# Patient Record
Sex: Female | Born: 1935 | Race: White | Hispanic: No | Marital: Married | Smoking: Never smoker
Health system: Southern US, Community
[De-identification: ages and names within clinical notes are randomized; demographics above are authoritative.]

## PROBLEM LIST (undated history)

## (undated) DIAGNOSIS — G610 Guillain-Barre syndrome: Secondary | ICD-10-CM

## (undated) DIAGNOSIS — I4891 Unspecified atrial fibrillation: Secondary | ICD-10-CM

## (undated) DIAGNOSIS — G629 Polyneuropathy, unspecified: Secondary | ICD-10-CM

## (undated) DIAGNOSIS — M6281 Muscle weakness (generalized): Secondary | ICD-10-CM

## (undated) DIAGNOSIS — E039 Hypothyroidism, unspecified: Secondary | ICD-10-CM

## (undated) DIAGNOSIS — B029 Zoster without complications: Secondary | ICD-10-CM

## (undated) DIAGNOSIS — I2699 Other pulmonary embolism without acute cor pulmonale: Secondary | ICD-10-CM

## (undated) HISTORY — DX: Hypothyroidism, unspecified: E03.9

## (undated) HISTORY — DX: Muscle weakness (generalized): M62.81

---

## 1963-11-28 HISTORY — PX: BREAST LUMPECTOMY: SHX2

## 1988-11-27 DIAGNOSIS — G610 Guillain-Barre syndrome: Secondary | ICD-10-CM

## 1988-11-27 DIAGNOSIS — I2699 Other pulmonary embolism without acute cor pulmonale: Secondary | ICD-10-CM

## 1988-11-27 HISTORY — DX: Guillain-Barre syndrome: G61.0

## 1988-11-27 HISTORY — DX: Other pulmonary embolism without acute cor pulmonale: I26.99

## 1998-03-29 ENCOUNTER — Other Ambulatory Visit: Admission: RE | Admit: 1998-03-29 | Discharge: 1998-03-29 | Payer: Self-pay | Admitting: Internal Medicine

## 1998-07-15 ENCOUNTER — Encounter: Admission: RE | Admit: 1998-07-15 | Discharge: 1998-10-13 | Payer: Self-pay | Admitting: Internal Medicine

## 2001-08-02 ENCOUNTER — Ambulatory Visit (HOSPITAL_COMMUNITY): Admission: RE | Admit: 2001-08-02 | Discharge: 2001-08-02 | Payer: Self-pay | Admitting: Gastroenterology

## 2004-02-15 ENCOUNTER — Encounter: Admission: RE | Admit: 2004-02-15 | Discharge: 2004-03-18 | Payer: Self-pay | Admitting: Internal Medicine

## 2006-03-07 ENCOUNTER — Inpatient Hospital Stay (HOSPITAL_COMMUNITY): Admission: EM | Admit: 2006-03-07 | Discharge: 2006-03-15 | Payer: Self-pay | Admitting: Emergency Medicine

## 2012-11-04 ENCOUNTER — Ambulatory Visit (INDEPENDENT_AMBULATORY_CARE_PROVIDER_SITE_OTHER): Payer: Medicare Other | Admitting: Emergency Medicine

## 2012-11-04 VITALS — BP 130/90 | HR 69 | Temp 97.4°F | Resp 16 | Wt 200.0 lb

## 2012-11-04 DIAGNOSIS — S91319A Laceration without foreign body, unspecified foot, initial encounter: Secondary | ICD-10-CM

## 2012-11-04 DIAGNOSIS — S91309A Unspecified open wound, unspecified foot, initial encounter: Secondary | ICD-10-CM

## 2012-11-04 NOTE — Progress Notes (Signed)
[facility] [address], [place] [address] [phone]  Date:  11/04/2012   Name:  Shirley Mccormick   DOB:  1925/04/11   MRN:  [id number]  PCP:  No primary provider on file.    Chief Complaint: Callouses   History of Present Illness:  Shirley Mccormick is a 78 y.o. very pleasant female patient who presents with the following:  Was picking at a callous on her foot this morning.  When she peeled off a piece of the skin, it began to briskly bleed.  She was unable to stop the bleeding and came here for evaluation.  She is allergic to tetanus and refused immunization.  She is taking coumadin.  Her most recent INR is not known but was therapeutic.  There is no problem list on file for this patient.   No past medical history on file.  No past surgical history on file.  History  Substance Use Topics  . Smoking status: Not on file  . Smokeless tobacco: Not on file  . Alcohol Use: Not on file    No family history on file.  Allergies  Allergen Reactions  . Erythromycin Rash  . Penicillins Rash  . Tetanus Toxoids Rash    Medication list has been reviewed and updated.  Current Outpatient Prescriptions on File Prior to Visit  Medication Sig Dispense Refill  . atenolol (TENORMIN) 50 MG tablet Take 50 mg by mouth daily.      . digoxin (LANOXIN) 0.125 MG tablet Take 0.125 mg by mouth daily.      . indapamide (LOZOL) 2.5 MG tablet Take 2.5 mg by mouth every morning.      Marland Kitchen levothyroxine (SYNTHROID, LEVOTHROID) 125 MCG tablet Take 125 mcg by mouth daily.      . potassium chloride (K-DUR) 10 MEQ tablet Take 10 mEq by mouth 2 (two) times daily.      Marland Kitchen warfarin (COUMADIN) 7.5 MG tablet Take 7.5 mg by mouth daily.        Review of Systems:  As per HPI, otherwise negative.    Physical Examination: Filed Vitals:   11/04/12 1329  BP: 130/90  Pulse: 69  Temp: 97.4 F (36.3 C)  Resp: 16   Filed Vitals:   11/04/12 1329  Weight: 200 lb (90.719 kg)    There is no height on file to calculate BMI. Ideal Body Weight:     GEN: WDWN, NAD, Non-toxic, Alert & Oriented x 3 HEENT: Atraumatic, Normocephalic.  Ears and Nose: No external deformity. EXTR: No clubbing/cyanosis/edema NEURO: Normal gait.  PSYCH: Normally interactive. Conversant. Not depressed or anxious appearing.  Calm demeanor.  Avulsion of skin from sole of foot.  Bleeding but slowed.    Assessment and Plan: Treated with drisol and pressure and wound dressed. Follow up in one week.  Carmelina Dane, MD

## 2013-10-01 ENCOUNTER — Emergency Department (HOSPITAL_COMMUNITY)
Admission: EM | Admit: 2013-10-01 | Discharge: 2013-10-01 | Disposition: A | Discharge status: Home / Self Care | Payer: Medicare Other | Attending: Emergency Medicine | Admitting: Emergency Medicine

## 2013-10-01 ENCOUNTER — Encounter (HOSPITAL_COMMUNITY): Payer: Self-pay | Admitting: Emergency Medicine

## 2013-10-01 DIAGNOSIS — R5381 Other malaise: Secondary | ICD-10-CM | POA: Insufficient documentation

## 2013-10-01 DIAGNOSIS — R29898 Other symptoms and signs involving the musculoskeletal system: Secondary | ICD-10-CM

## 2013-10-01 DIAGNOSIS — Z8619 Personal history of other infectious and parasitic diseases: Secondary | ICD-10-CM | POA: Insufficient documentation

## 2013-10-01 DIAGNOSIS — Z7901 Long term (current) use of anticoagulants: Secondary | ICD-10-CM | POA: Insufficient documentation

## 2013-10-01 DIAGNOSIS — Z79899 Other long term (current) drug therapy: Secondary | ICD-10-CM | POA: Insufficient documentation

## 2013-10-01 DIAGNOSIS — M6250 Muscle wasting and atrophy, not elsewhere classified, unspecified site: Secondary | ICD-10-CM | POA: Insufficient documentation

## 2013-10-01 DIAGNOSIS — B029 Zoster without complications: Secondary | ICD-10-CM

## 2013-10-01 DIAGNOSIS — E079 Disorder of thyroid, unspecified: Secondary | ICD-10-CM | POA: Insufficient documentation

## 2013-10-01 DIAGNOSIS — I4891 Unspecified atrial fibrillation: Secondary | ICD-10-CM | POA: Insufficient documentation

## 2013-10-01 DIAGNOSIS — Z880 Allergy status to penicillin: Secondary | ICD-10-CM | POA: Insufficient documentation

## 2013-10-01 DIAGNOSIS — Z86711 Personal history of pulmonary embolism: Secondary | ICD-10-CM | POA: Insufficient documentation

## 2013-10-01 DIAGNOSIS — Z8669 Personal history of other diseases of the nervous system and sense organs: Secondary | ICD-10-CM | POA: Insufficient documentation

## 2013-10-01 HISTORY — DX: Other pulmonary embolism without acute cor pulmonale: I26.99

## 2013-10-01 HISTORY — DX: Zoster without complications: B02.9

## 2013-10-01 HISTORY — DX: Guillain-Barre syndrome: G61.0

## 2013-10-01 HISTORY — DX: Polyneuropathy, unspecified: G62.9

## 2013-10-01 HISTORY — DX: Unspecified atrial fibrillation: I48.91

## 2013-10-01 LAB — URINALYSIS W MICROSCOPIC + REFLEX CULTURE
Bilirubin Urine: NEGATIVE
Hgb urine dipstick: NEGATIVE
Specific Gravity, Urine: 1.015 (ref 1.005–1.030)
Urobilinogen, UA: 0.2 mg/dL (ref 0.0–1.0)
pH: 5.5 (ref 5.0–8.0)

## 2013-10-01 LAB — CBC WITH DIFFERENTIAL/PLATELET
Basophils Absolute: 0 10*3/uL (ref 0.0–0.1)
Basophils Relative: 0 % (ref 0–1)
Eosinophils Absolute: 0.1 10*3/uL (ref 0.0–0.7)
Eosinophils Relative: 2 % (ref 0–5)
HCT: 39.3 % (ref 36.0–46.0)
Hemoglobin: 13.5 g/dL (ref 12.0–15.0)
MCHC: 34.4 g/dL (ref 30.0–36.0)
MCV: 87.5 fL (ref 78.0–100.0)
Monocytes Absolute: 0.6 10*3/uL (ref 0.1–1.0)
Monocytes Relative: 11 % (ref 3–12)

## 2013-10-01 LAB — PROTIME-INR: Prothrombin Time: 27.2 seconds — ABNORMAL HIGH (ref 11.6–15.2)

## 2013-10-01 LAB — COMPREHENSIVE METABOLIC PANEL
Alkaline Phosphatase: 47 U/L (ref 39–117)
BUN: 12 mg/dL (ref 6–23)
CO2: 26 mEq/L (ref 19–32)
Chloride: 92 mEq/L — ABNORMAL LOW (ref 96–112)
GFR calc Af Amer: 71 mL/min — ABNORMAL LOW (ref >90)
Total Bilirubin: 0.4 mg/dL (ref 0.3–1.2)
Total Protein: 7.6 g/dL (ref 6.0–8.3)

## 2013-10-01 NOTE — Progress Notes (Signed)
Pt accepted to [facility], Skilled Nursing Facility. RN can call report to [phone]. Pt to be transported by non emergency EMS. .No further Clinical Social Work needs, signing off.   Catha Gosselin, LCSW [phone]  ED CSW .10/01/2013 1435pm

## 2013-10-01 NOTE — ED Notes (Addendum)
[facility] pt c/o weakness x 1 week. Denies n/v, pain. No complaints of any other symptoms. Started medication for shingles and states she started to feel more weak.

## 2013-10-01 NOTE — ED Provider Notes (Signed)
CSN: [id number]     Arrival date & time 10/01/13  1009 History   First MD Initiated Contact with Patient 10/01/13 1121     Chief Complaint  Patient presents with  . Weakness   (Consider location/radiation/quality/duration/timing/severity/associated sxs/prior Treatment) Patient is a 79 y.o. female presenting with weakness. The history is provided by the patient and the spouse.  Weakness This is a new problem. The current episode started more than 2 days ago. The problem occurs constantly. The problem has been gradually worsening. Pertinent negatives include no chest pain, no abdominal pain, no headaches and no shortness of breath. Nothing aggravates the symptoms. Nothing relieves the symptoms. She has tried nothing for the symptoms. The treatment provided no relief.    Past Medical History  Diagnosis Date  . Afib   . Shingles   . Pulmonary embolism 1990  . Guillain-Barre 1990    resolved  . Thyroid disease hypo  . Neuropathy    History reviewed. No pertinent past surgical history. No family history on file. History  Substance Use Topics  . Smoking status: Not on file  . Smokeless tobacco: Not on file  . Alcohol Use: Not on file   OB History   Grav Para Term Preterm Abortions TAB SAB Ect Mult Living                 Review of Systems  Constitutional: Negative for fever and fatigue.  HENT: Negative for congestion and drooling.   Eyes: Negative for pain.  Respiratory: Negative for cough and shortness of breath.   Cardiovascular: Negative for chest pain.  Gastrointestinal: Negative for nausea, vomiting, abdominal pain and diarrhea.  Genitourinary: Negative for dysuria and hematuria.  Musculoskeletal: Negative for back pain, gait problem and neck pain.  Skin: Negative for color change.  Neurological: Positive for weakness. Negative for dizziness and headaches.  Hematological: Negative for adenopathy.  Psychiatric/Behavioral: Negative for behavioral problems.  All other  systems reviewed and are negative.    Allergies  Erythromycin; Penicillins; and Tetanus toxoids  Home Medications   Current Outpatient Rx  Name  Route  Sig  Dispense  Refill  . acetaminophen (TYLENOL) 650 MG CR tablet   Oral   Take 650 mg by mouth every 8 (eight) hours as needed for pain.          Marland Kitchen atenolol (TENORMIN) 50 MG tablet   Oral   Take 50 mg by mouth daily.         . cholecalciferol (VITAMIN D) 1000 UNITS tablet   Oral   Take 1,000 Units by mouth daily.         Marland Kitchen darifenacin (ENABLEX) 7.5 MG 24 hr tablet   Oral   Take 7.5 mg by mouth daily.         . digoxin (LANOXIN) 0.125 MG tablet   Oral   Take 0.125 mg by mouth daily.         . fish oil-omega-3 fatty acids 1000 MG capsule   Oral   Take 2 g by mouth daily.         Marland Kitchen glucosamine-chondroitin 500-400 MG tablet   Oral   Take 1 tablet by mouth 3 (three) times daily.         . indapamide (LOZOL) 2.5 MG tablet   Oral   Take 2.5 mg by mouth every morning.         Marland Kitchen levothyroxine (SYNTHROID, LEVOTHROID) 125 MCG tablet   Oral   Take 125 mcg by  mouth daily.         . Multiple Vitamins-Minerals (MULTIVITAMIN WITH IRON-MINERALS) liquid   Oral   Take by mouth daily.         . potassium chloride (K-DUR) 10 MEQ tablet   Oral   Take 10 mEq by mouth 2 (two) times daily.         Marland Kitchen warfarin (COUMADIN) 7.5 MG tablet   Oral   Take 7.5 mg by mouth daily.          BP 142/48  Pulse 82  Temp(Src) 97.8 F (36.6 C) (Oral)  Resp 20  SpO2 97% Physical Exam  Nursing note and vitals reviewed. Constitutional: She is oriented to person, place, and time. She appears well-developed and well-nourished.  HENT:  Head: Normocephalic.  Mouth/Throat: No oropharyngeal exudate.  Eyes: Conjunctivae and EOM are normal. Pupils are equal, round, and reactive to light.  Neck: Normal range of motion. Neck supple.  Cardiovascular: Normal rate, regular rhythm, normal heart sounds and intact distal pulses.   Exam reveals no gallop and no friction rub.   No murmur heard. Pulmonary/Chest: Effort normal and breath sounds normal. No respiratory distress. She has no wheezes.  Abdominal: Soft. Bowel sounds are normal. There is no tenderness. There is no rebound and no guarding.  Musculoskeletal: Normal range of motion. She exhibits no edema and no tenderness.  Neurological: She is alert and oriented to person, place, and time. No cranial nerve deficit or sensory deficit.  Reflex Scores:      Patellar reflexes are 1+ on the right side and 1+ on the left side.      Achilles reflexes are 1+ on the right side and 1+ on the left side. 4/5 strength in RLE. 2/5 strength in LLE.   Normal strength in UE's.   Skin: Skin is warm and dry.  Psychiatric: She has a normal mood and affect. Her behavior is normal.    ED Course  Procedures (including critical care time) Labs Review Labs Reviewed  COMPREHENSIVE METABOLIC PANEL - Abnormal; Notable for the following:    Sodium 131 (*)    Chloride 92 (*)    Glucose, Bld 109 (*)    GFR calc non Af Amer 61 (*)    GFR calc Af Amer 71 (*)    All other components within normal limits  PROTIME-INR - Abnormal; Notable for the following:    Prothrombin Time 27.2 (*)    INR 2.63 (*)    All other components within normal limits  URINALYSIS W MICROSCOPIC + REFLEX CULTURE  CBC WITH DIFFERENTIAL   Imaging Review No results found.  EKG Interpretation     Ventricular Rate:  80 PR Interval:    QRS Duration: 72 QT Interval:  394 QTC Calculation: 454 R Axis:   45 Text Interpretation:  Atrial fibrillation Anteroseptal infarct, old            MDM   1. Muscular deconditioning   2. Lower extremity weakness    12:34 PM 79 y.o. female who presents with weakness in her lower extremities which began gradually 4-5 days ago. The patient was diagnosed with shingles on her left lower back and left abdomen last week. The husband notes that the patient had difficulty  ambulating at baseline. Since she has gotten shingles she has become weaker and is now not able to get up to go to the bathroom. Her husband was able to help her initially but now he is unable to help get  her to the bathroom. She denies any vomiting, diarrhea, or fevers. The patient is afebrile and vital signs are unremarkable here. I suspect deconditioning from her episode of shingles.   2:50 PM: Interpreted labs which are non-contrib. While pt does have a remote hx of guillain-barre syndrome, I don't think that is what is causing her sx today. I had SW see here and SW has arranged for her to go to a SNF [facility]). I have discussed the diagnosis/risks/treatment options with the patient and family and believe the pt to be eligible for discharge home to follow-up with pcp as needed. We also discussed returning to the ED immediately if new or worsening sx occur. We discussed the sx which are most concerning (e.g., worsening weakness, fever, worsening rash) that necessitate immediate return. Any new prescriptions provided to the patient are listed below.  New Prescriptions   No medications on file     Junius Argyle, MD 10/01/13 1542

## 2013-10-01 NOTE — Progress Notes (Signed)
   CARE MANAGEMENT ED NOTE 10/01/2013  Patient:  Shirley Mccormick, Shirley Mccormick   Account Number:  [id number]  Date Initiated:  10/01/2013  Documentation initiated by:  Edd Arbour  Subjective/Objective Assessment:   79 yr old [facility] lives [facility] with spouse who is at the bedside PMH Guillain barre and peripheral neuropathy dx in [facility] ED with shingles c/o weakness     Subjective/Objective Assessment Detail:   spouse states sent to ED by their facility Spouse refuses home health services as indicated in EDP's Cm consult He want pt to be moved from independent level of care to snf level of care until she is able to use her walker States he was informed that the ED SW needs to speak with the facility SW     Action/Plan:   CM spoke with pt and spouse and then called EDP and ED SW   Action/Plan Detail:   Anticipated DC Date:  10/01/2013     Status Recommendation to Physician:   Result of Recommendation:    Other ED Services  Consult Working Plan   In-house referral  Clinical Social Worker   DC Planning Services  Outpatient Services - Pt will follow up  Patient refused services  CM consult    Choice offered to / List presented to:  C-1 Patient          Status of service:  Completed, signed off  ED Comments:   ED Comments Detail:

## 2013-10-01 NOTE — Progress Notes (Signed)
CSW received consult from rn cm, who states patient and pt husband are interested in patient moving from independently living to health care center. Pt recently diagnosed with shingles. CSW spoke with [facility], admissions coordinator at [phone] [facility] regarding patient admission. CSW completing FL2 to submit to [facility].    .Catha Gosselin, [place] [phone]  ED CSW .10/01/2013 1314pm

## 2013-10-01 NOTE — ED Notes (Signed)
Bed: WU98 Expected date:  Expected time:  Means of arrival:  Comments: weakness

## 2013-10-02 ENCOUNTER — Encounter: Payer: Self-pay | Admitting: Internal Medicine

## 2013-10-02 ENCOUNTER — Non-Acute Institutional Stay (SKILLED_NURSING_FACILITY): Payer: Medicare Other | Admitting: Internal Medicine

## 2013-10-02 DIAGNOSIS — B029 Zoster without complications: Secondary | ICD-10-CM

## 2013-10-02 DIAGNOSIS — M6250 Muscle wasting and atrophy, not elsewhere classified, unspecified site: Secondary | ICD-10-CM

## 2013-10-02 DIAGNOSIS — R29898 Other symptoms and signs involving the musculoskeletal system: Secondary | ICD-10-CM

## 2013-10-02 LAB — TSH: TSH: 3.57 u[IU]/mL (ref 0.41–5.90)

## 2013-10-02 LAB — CBC AND DIFFERENTIAL
Platelets: 210 10*3/uL (ref 150–399)
WBC: 5.2 10^3/mL

## 2013-10-02 LAB — BASIC METABOLIC PANEL
BUN: 11 mg/dL (ref 4–21)
Creatinine: 0.7 mg/dL (ref 0.5–1.1)
Sodium: 133 mmol/L — AB (ref 137–147)

## 2013-10-02 NOTE — Progress Notes (Signed)
Patient ID: Shirley Mccormick, female   DOB: May 27, 1925, 79 y.o.   MRN: [id number] Nursing Home Location:  [facility] , Room N32  Place of Service: SNF (31)  PCP: Gwen Pounds, MD  Code Status: living will   Allergies  Allergen Reactions  . Erythromycin Rash  . Penicillins Rash  . Tetanus Toxoids Rash    Chief Complaint  Patient presents with  . Hospitalization Follow-up    New SNF  admission    HPI:  Patient was admitted to skilled nursing facility following a trip to the emergency room on 10/01/13. She had the onset of a rash on the left flank and abdomen starting 09/29/13. This is herpes zoster. At the same time she has become exceedingly weak and is unable to stand safely. Her husband could not assist her to the bathroom. She has been living independently with her husband at [facility]. She as not safe to return to her apartment at this time due to her weakness and risk of falling. Her husband cannot manage her.  Patient has a known prior history of Guillain-Barr syndrome in 1990. She had an early complete recovery initially, then seemed to relapse with increasing weakness in the muscles in the last decade. Despite the weakness she has been able to toilet and Bathe.  She is now here for short-term rehabilitation. She is on valacyclovir for the herpes zoster. Some skin is peeling on the left flank as result of her herpes zoster, but she is not experiencing any pain. She will be referred to physical therapy and occupational therapy to maximize her return to self-care.  Anticoagulated with warfarin due to chronic AF and a prior history of PE.  Past Medical History  Diagnosis Date  . Afib   . Shingles 10/01/13  . Pulmonary embolism 1990  . Guillain-Barre 1990    resolved  . Unspecified hypothyroidism   . Neuropathy    Past Surgical History  Procedure Laterality Date  . Breast lumpectomy Right 1965    Dr. Judithe Modest   Immunization History  Administered Date(s)  Administered  . Pneumococcal Conjugate 01/09/1997  Allergic to Tetanus   Social History:   History   Social History  . Marital Status: Married    Spouse Name: N/A    Number of Children: N/A  . Years of Education: N/A   Social History Main Topics  . Smoking status: Never Smoker   . Smokeless tobacco: Not on file  . Alcohol Use: No  . Drug Use: No  . Sexual Activity: Not Currently   Other Topics Concern  . Not on file   Social History Narrative   Living Will   Resident at [facility] since 2006.   Lived in Independent Living with her husband.     Family Status  Relation Status Death Age  . Father Deceased 57    coronary disease  . Mother Deceased 36    old age  . Sister Deceased 36    coronary disease  . Sister Deceased 46    coronary disease  . Brother Deceased 21    plane crash     Medications: Patient's Medications  New Prescriptions   No medications on file  Previous Medications   ACETAMINOPHEN (TYLENOL) 650 MG CR TABLET    Take 650 mg by mouth every 8 (eight) hours as needed for pain.    ATENOLOL (TENORMIN) 50 MG TABLET    Take 50 mg by mouth daily.   CHOLECALCIFEROL (VITAMIN  D) 1000 UNITS TABLET    Take 1,000 Units by mouth daily.   DARIFENACIN (ENABLEX) 7.5 MG 24 HR TABLET    Take 7.5 mg by mouth daily.   DIGOXIN (LANOXIN) 0.125 MG TABLET    Take 0.125 mg by mouth daily.   FISH OIL-OMEGA-3 FATTY ACIDS 1000 MG CAPSULE    Take 2 g by mouth daily.   GLUCOSAMINE-CHONDROITIN 500-400 MG TABLET    Take 1 tablet by mouth 3 (three) times daily.   INDAPAMIDE (LOZOL) 2.5 MG TABLET    Take 2.5 mg by mouth every morning.   LEVOTHYROXINE (SYNTHROID, LEVOTHROID) 125 MCG TABLET    Take 125 mcg by mouth daily.   MULTIPLE VITAMINS-MINERALS (MULTIVITAMIN WITH IRON-MINERALS) LIQUID    Take by mouth daily.   POTASSIUM CHLORIDE (K-DUR) 10 MEQ TABLET    Take 10 mEq by mouth 2 (two) times daily.   VALACYCLOVIR (VALTREX) 1000 MG TABLET       WARFARIN (COUMADIN) 7.5 MG TABLET     Take 7.5 mg by mouth daily.  Modified Medications   No medications on file  Discontinued Medications   No medications on file   Review of Systems  Constitutional: Positive for malaise/fatigue. Negative for fever, chills, weight loss and diaphoresis.  HENT: Positive for hearing loss. Negative for ear pain, sore throat and tinnitus.   Eyes: Negative for blurred vision, double vision, photophobia and discharge.  Respiratory: Positive for shortness of breath. Negative for hemoptysis, sputum production and wheezing.   Cardiovascular: Negative for chest pain, palpitations, orthopnea, claudication, leg swelling and PND.  Gastrointestinal: Negative for heartburn, nausea, vomiting, abdominal pain, diarrhea, constipation and blood in stool.  Genitourinary: Positive for urgency. Negative for dysuria, frequency, hematuria and flank pain.  Musculoskeletal: Positive for back pain, myalgias and neck pain. Negative for joint pain.       Unstable balance. High risk for falls.  Skin: Positive for rash.       New herpes zoster of the left flank and abdomen.  Neurological: Positive for sensory change, focal weakness and weakness. Negative for dizziness, tingling, tremors, speech change, seizures and loss of consciousness.  Endo/Heme/Allergies: Negative for environmental allergies and polydipsia. Does not bruise/bleed easily.  Psychiatric/Behavioral: Negative for depression, suicidal ideas, hallucinations and substance abuse. The patient is not nervous/anxious and does not have insomnia.    BP 142/92  Pulse 84  Temp(Src) 99 F (37.2 C)  Resp 20  Ht 5\' 5"  (1.651 m)  Wt 200 lb (90.719 kg)  BMI 33.28 kg/m2   Physical Exam  Constitutional: She is oriented to person, place, and time. She appears distressed.  Moderately overweight.  HENT:  Head: Normocephalic and atraumatic.  Right Ear: External ear normal.  Left Ear: External ear normal.  Nose: Nose normal.  Mouth/Throat: Oropharynx is clear and moist.   Significant hearing loss. Generally wears hearing aids.  Eyes: Conjunctivae and EOM are normal. Pupils are equal, round, and reactive to light.  Left upper eyelid droop.  Neck: No JVD present. No tracheal deviation present. No thyromegaly present.  Cardiovascular: Normal rate and normal heart sounds.  Exam reveals no gallop and no friction rub.   No murmur heard. Atrial fibrillation.  Pulmonary/Chest: Effort normal and breath sounds normal. No respiratory distress. She has no wheezes. She has no rales.  Abdominal: She exhibits no distension and no mass. There is no tenderness.  Musculoskeletal: Normal range of motion. She exhibits tenderness (Tender in both knees). She exhibits no edema.  Lymphadenopathy:  She has no cervical adenopathy.  Neurological: She is alert and oriented to person, place, and time. No cranial nerve deficit.  Weakness in both legs. Unable to stand unassisted.  Skin: Rash (Herpes zoster left flank and abdomen) noted. No erythema. No pallor.  Psychiatric: She has a normal mood and affect. Her behavior is normal. Thought content normal.     Labs reviewed: Admission on 10/01/2013, Discharged on 10/01/2013  Component Date Value Range Status  . Sodium 10/01/2013 131* 135 - 145 mEq/L Final  . Potassium 10/01/2013 3.7  3.5 - 5.1 mEq/L Final  . Chloride 10/01/2013 92* 96 - 112 mEq/L Final  . CO2 10/01/2013 26  19 - 32 mEq/L Final  . Glucose, Bld 10/01/2013 109* 70 - 99 mg/dL Final  . BUN 16/08/9603 12  6 - 23 mg/dL Final  . Creatinine, Ser 10/01/2013 0.84  0.50 - 1.10 mg/dL Final  . Calcium 54/07/8118 9.4  8.4 - 10.5 mg/dL Final  . Total Protein 10/01/2013 7.6  6.0 - 8.3 g/dL Final  . Albumin 14/78/2956 3.7  3.5 - 5.2 g/dL Final  . AST 21/30/8657 31  0 - 37 U/L Final  . ALT 10/01/2013 21  0 - 35 U/L Final  . Alkaline Phosphatase 10/01/2013 47  39 - 117 U/L Final  . Total Bilirubin 10/01/2013 0.4  0.3 - 1.2 mg/dL Final  . GFR calc non Af Amer 10/01/2013 61* >90  mL/min Final  . GFR calc Af Amer 10/01/2013 71* >90 mL/min Final   Comment: (NOTE)                          The eGFR has been calculated using the CKD EPI equation.                          This calculation has not been validated in all clinical situations.                          eGFR's persistently <90 mL/min signify possible Chronic Kidney                          Disease.  Marland Kitchen Prothrombin Time 10/01/2013 27.2* 11.6 - 15.2 seconds Final  . INR 10/01/2013 2.63* 0.00 - 1.49 Final  . Color, Urine 10/01/2013 YELLOW  YELLOW Final  . APPearance 10/01/2013 CLEAR  CLEAR Final  . Specific Gravity, Urine 10/01/2013 1.015  1.005 - 1.030 Final  . pH 10/01/2013 5.5  5.0 - 8.0 Final  . Glucose, UA 10/01/2013 NEGATIVE  NEGATIVE mg/dL Final  . Hgb urine dipstick 10/01/2013 NEGATIVE  NEGATIVE Final  . Bilirubin Urine 10/01/2013 NEGATIVE  NEGATIVE Final  . Ketones, ur 10/01/2013 NEGATIVE  NEGATIVE mg/dL Final  . Protein, ur 84/69/6295 NEGATIVE  NEGATIVE mg/dL Final  . Urobilinogen, UA 10/01/2013 0.2  0.0 - 1.0 mg/dL Final  . Nitrite 28/41/3244 NEGATIVE  NEGATIVE Final  . Leukocytes, UA 10/01/2013 NEGATIVE  NEGATIVE Final  . WBC, UA 10/01/2013 0-2  <3 WBC/hpf Final  . WBC 10/01/2013 5.2  4.0 - 10.5 K/uL Final  . RBC 10/01/2013 4.49  3.87 - 5.11 MIL/uL Final  . Hemoglobin 10/01/2013 13.5  12.0 - 15.0 g/dL Final  . HCT 11/29/7251 39.3  36.0 - 46.0 % Final  . MCV 10/01/2013 87.5  78.0 - 100.0 fL Final  . MCH 10/01/2013 30.1  26.0 -  34.0 pg Final  . MCHC 10/01/2013 34.4  30.0 - 36.0 g/dL Final  . RDW 78/29/5621 13.0  11.5 - 15.5 % Final  . Platelets 10/01/2013 187  150 - 400 K/uL Final  . Neutrophils Relative % 10/01/2013 58  43 - 77 % Final  . Neutro Abs 10/01/2013 3.0  1.7 - 7.7 K/uL Final  . Lymphocytes Relative 10/01/2013 29  12 - 46 % Final  . Lymphs Abs 10/01/2013 1.5  0.7 - 4.0 K/uL Final  . Monocytes Relative 10/01/2013 11  3 - 12 % Final  . Monocytes Absolute 10/01/2013 0.6  0.1 - 1.0 K/uL  Final  . Eosinophils Relative 10/01/2013 2  0 - 5 % Final  . Eosinophils Absolute 10/01/2013 0.1  0.0 - 0.7 K/uL Final  . Basophils Relative 10/01/2013 0  0 - 1 % Final  . Basophils Absolute 10/01/2013 0.0  0.0 - 0.1 K/uL Final     Assessment/Plan  Lower extremity weakness: Enroll in physical therapy for strengthening, training in safe ambulation, and to increase self care ability.  Muscular deconditioning: refer to PT  Herpes zoster: finish another week of valacyclovir  Anticoagulation: follow INR (2.63 yesterday)

## 2013-10-03 ENCOUNTER — Non-Acute Institutional Stay (SKILLED_NURSING_FACILITY): Payer: Medicare Other | Admitting: Nurse Practitioner

## 2013-10-03 ENCOUNTER — Encounter: Payer: Self-pay | Admitting: Nurse Practitioner

## 2013-10-03 DIAGNOSIS — I4891 Unspecified atrial fibrillation: Secondary | ICD-10-CM

## 2013-10-03 DIAGNOSIS — Z5181 Encounter for therapeutic drug level monitoring: Secondary | ICD-10-CM | POA: Insufficient documentation

## 2013-10-03 DIAGNOSIS — E039 Hypothyroidism, unspecified: Secondary | ICD-10-CM | POA: Insufficient documentation

## 2013-10-03 DIAGNOSIS — E871 Hypo-osmolality and hyponatremia: Secondary | ICD-10-CM

## 2013-10-03 DIAGNOSIS — R32 Unspecified urinary incontinence: Secondary | ICD-10-CM

## 2013-10-03 DIAGNOSIS — B029 Zoster without complications: Secondary | ICD-10-CM

## 2013-10-03 DIAGNOSIS — R269 Unspecified abnormalities of gait and mobility: Secondary | ICD-10-CM

## 2013-10-03 DIAGNOSIS — G629 Polyneuropathy, unspecified: Secondary | ICD-10-CM

## 2013-10-03 DIAGNOSIS — Z7901 Long term (current) use of anticoagulants: Secondary | ICD-10-CM

## 2013-10-03 DIAGNOSIS — R29898 Other symptoms and signs involving the musculoskeletal system: Secondary | ICD-10-CM

## 2013-10-03 DIAGNOSIS — G609 Hereditary and idiopathic neuropathy, unspecified: Secondary | ICD-10-CM

## 2013-10-03 LAB — PROTIME-INR: Protime: 30.4 seconds — AB (ref 10.0–13.8)

## 2013-10-03 NOTE — Assessment & Plan Note (Signed)
For Afib. Mild supra-therapeutic INR 3.08, will continue 7.5mg  daily M-F, 7mg  Sat and Sun, repeat PT/INR in one week.

## 2013-10-03 NOTE — Assessment & Plan Note (Signed)
Off Enablex since 10/02/13. The patient stated her urinary frequency and sense of incomplete emptying bladder persisted while on Enable. She has never had Urology evaluation. Enablex was stopped 10/02/13 while in SNF for further evaluation of effectiveness and possible AR

## 2013-10-03 NOTE — Assessment & Plan Note (Signed)
Apparently the patient was walking with walker for short distance at home prior to onset of shingles. No focal neurological deficits noted except she appears sleepy-but is easily awakened and recall my name correctly.

## 2013-10-03 NOTE — Assessment & Plan Note (Signed)
Lesions opened and no s/s secondary infection. She denied pain, but her dtr and husband reported the patient could not get comfortable/figiting last night. She took Tylenol 500mg  II bid and PM II qhs at home-will resume home Tylenol therapy for now. Velcyclovir was extended to 10/09/13 due to non healing nature of the skin lesions.

## 2013-10-03 NOTE — Progress Notes (Signed)
Patient ID: Shirley Mccormick, female   DOB: 1925-11-27, 79 y.o.   MRN: [id number] Code Status: DNR  Allergies  Allergen Reactions  . Erythromycin Rash  . Penicillins Rash  . Tetanus Toxoids Rash    Chief Complaint  Patient presents with  . Medical Managment of Chronic Issues    pain, hyponatremia, generalized weakness, anticoagulation management.   . Acute Visit    HPI: Patient is a 79 y.o. female seen in the SNF at [facility] today for evaluation of Afib, anticoagulation management, herpetic pain, gait disorder, hyponatremia,  and other chronic medical conditions.  Problem List Items Addressed This Visit   A-fib     Rate controlled on Digoxin 0.125mg  and Atenolol 50mg  daily, Dig level 0.3 10/02/13    Abnormality of gait     Her baseline is to ambulates with walker to/from chair to bathroom at home. Worse since onset of shingles. Here for Rehab [place] able.     Anticoagulation goal of INR 2 to 3     For Afib. Mild supra-therapeutic INR 3.08, will continue 7.5mg  daily M-F, 7mg  Sat and Sun, repeat PT/INR in one week.     Herpes zoster - Primary     Lesions opened and no s/s secondary infection. She denied pain, but her dtr and husband reported the patient could not get comfortable/figiting last night. She took Tylenol 500mg  II bid and PM II qhs at home-will resume home Tylenol therapy for now. Velcyclovir was extended to 10/09/13 due to non healing nature of the skin lesions.     Hyponatremia     Mild, it was 13111/5/14, then 133 10/02/13. She takes Indapamide 2.5mg . Also poor oral intake contributed to the problem. Repeat BMP in one week.     Lower extremity weakness     Apparently the patient was walking with walker for short distance at home prior to onset of shingles. No focal neurological deficits noted except she appears sleepy-but is easily awakened and recall my name correctly.     Peripheral neuropathy     Chronic and  longstanding needle pricking pain in feet. Takes Tylenol routinely at home. Hx of Guillain Barre.      Unspecified hypothyroidism     Adequately supplemented with Levothyroxine , TSH 3.573 10/02/13    Urine incontinence     Off Enablex since 10/02/13. The patient stated her urinary frequency and sense of incomplete emptying bladder persisted while on Enable. She has never had Urology evaluation. Enablex was stopped 10/02/13 while in SNF for further evaluation of effectiveness and possible AR        Review of Systems:  Review of Systems  Constitutional: Positive for malaise/fatigue. Negative for fever, chills, weight loss and diaphoresis.       Generalized and lower body weakness.   HENT: Positive for hearing loss. Negative for congestion, ear discharge, ear pain, nosebleeds, sore throat and tinnitus.   Eyes: Negative for blurred vision, double vision, photophobia, pain, discharge and redness.  Respiratory: Negative for cough, hemoptysis, sputum production, shortness of breath, wheezing and stridor.   Cardiovascular: Negative for chest pain, palpitations, orthopnea, claudication, leg swelling and PND.  Gastrointestinal: Negative for heartburn, nausea, vomiting, abdominal pain, diarrhea, constipation, blood in stool and melena.  Genitourinary: Positive for frequency. Negative for dysuria, urgency, hematuria and flank pain.  Musculoskeletal: Positive for back pain. Negative for falls, joint pain, myalgias and neck pain.  From her left shingle site travels to her left back.   Skin: Positive for rash.       Left flank and abd shingle lesions  Neurological: Positive for sensory change and weakness. Negative for dizziness, tingling, tremors, speech change, focal weakness, seizures, loss of consciousness and headaches.       BLE needle pricking   Endo/Heme/Allergies: Negative for environmental allergies and polydipsia. Bruises/bleeds easily.  Psychiatric/Behavioral: Positive for memory  loss. Negative for depression, suicidal ideas, hallucinations and substance abuse. The patient is not nervous/anxious and does not have insomnia.        Mild confusion occasionally.    (type .ros)  Past Medical History  Diagnosis Date  . Afib   . Shingles 10/01/13  . Pulmonary embolism 1990  . Guillain-Barre 1990    resolved  . Unspecified hypothyroidism   . Neuropathy    Past Surgical History  Procedure Laterality Date  . Breast lumpectomy Right 1965    Dr. Judithe Modest   Social History:   reports that she has never smoked. She does not have any smokeless tobacco history on file. She reports that she does not drink alcohol or use illicit drugs.  No family history on file.  Medications: Patient's Medications  New Prescriptions   No medications on file  Previous Medications   ACETAMINOPHEN (TYLENOL) 650 MG CR TABLET    Take 650 mg by mouth every 8 (eight) hours as needed for pain.    ATENOLOL (TENORMIN) 50 MG TABLET    Take 50 mg by mouth daily.   CHOLECALCIFEROL (VITAMIN D) 1000 UNITS TABLET    Take 1,000 Units by mouth daily.   DARIFENACIN (ENABLEX) 7.5 MG 24 HR TABLET    Take 7.5 mg by mouth daily.   DIGOXIN (LANOXIN) 0.125 MG TABLET    Take 0.125 mg by mouth daily.   FISH OIL-OMEGA-3 FATTY ACIDS 1000 MG CAPSULE    Take 2 g by mouth daily.   GLUCOSAMINE-CHONDROITIN 500-400 MG TABLET    Take 1 tablet by mouth 3 (three) times daily.   INDAPAMIDE (LOZOL) 2.5 MG TABLET    Take 2.5 mg by mouth every morning.   LEVOTHYROXINE (SYNTHROID, LEVOTHROID) 125 MCG TABLET    Take 125 mcg by mouth daily.   MULTIPLE VITAMINS-MINERALS (MULTIVITAMIN WITH IRON-MINERALS) LIQUID    Take by mouth daily.   POTASSIUM CHLORIDE (K-DUR) 10 MEQ TABLET    Take 10 mEq by mouth 2 (two) times daily.   VALACYCLOVIR (VALTREX) 1000 MG TABLET       WARFARIN (COUMADIN) 7.5 MG TABLET    Take 7.5 mg by mouth daily.  Modified Medications   No medications on file  Discontinued Medications   No medications on file      Physical Exam: Physical Exam  Constitutional: She is oriented to person, place, and time. She appears well-developed and well-nourished. No distress.  HENT:  Head: Normocephalic and atraumatic.  Right Ear: External ear normal.  Left Ear: External ear normal.  Nose: Nose normal.  Mouth/Throat: Oropharynx is clear and moist. No oropharyngeal exudate.  Eyes: Conjunctivae and EOM are normal. Pupils are equal, round, and reactive to light. Right eye exhibits no discharge. Left eye exhibits no discharge. No scleral icterus.  Neck: Normal range of motion. Neck supple. No JVD present. No tracheal deviation present. No thyromegaly present.  Cardiovascular: Normal rate, regular rhythm, normal heart sounds and intact distal pulses.   No murmur heard. Pulmonary/Chest: Effort normal and breath sounds normal. No stridor.  No respiratory distress. She has no wheezes. She has no rales.  Abdominal: Soft. Bowel sounds are normal. She exhibits no distension. There is no tenderness. There is no rebound and no guarding.  Musculoskeletal: Normal range of motion. She exhibits no edema and no tenderness.  Unable to stand on her own.   Lymphadenopathy:    She has no cervical adenopathy.  Neurological: She is alert and oriented to person, place, and time. She has normal reflexes. She displays normal reflexes. No cranial nerve deficit. She exhibits normal muscle tone. Coordination normal.  Skin: Skin is warm and dry. Rash noted. She is not diaphoretic. There is erythema.  Shingles left flank and abd. Opened vesicles presents w/o s/s of secondary infection.   Psychiatric: She has a normal mood and affect. Her behavior is normal. Judgment and thought content normal.    Filed Vitals:   10/03/13 1309  BP: 142/76  Pulse: 84  Temp: 98.1 F (36.7 C)  TempSrc: Tympanic  Resp: 20      Labs reviewed: Basic Metabolic Panel:  Recent Labs  57/84/69 1301 10/02/13  NA 131* 133*  K 3.7 3.7  CL 92*  --    CO2 26  --   GLUCOSE 109*  --   BUN 12 11  CREATININE 0.84 0.7  CALCIUM 9.4  --   TSH  --  3.57   Liver Function Tests:  Recent Labs  10/01/13 1301  AST 31  ALT 21  ALKPHOS 47  BILITOT 0.4  PROT 7.6  ALBUMIN 3.7   No results found for this basename: LIPASE, AMYLASE,  in the last 8760 hours No results found for this basename: AMMONIA,  in the last 8760 hours CBC:  Recent Labs  10/01/13 1301 10/02/13  WBC 5.2 5.2  NEUTROABS 3.0  --   HGB 13.5 14.0  HCT 39.3 40  MCV 87.5  --   PLT 187 210   Lipid Panel: No results found for this basename: CHOL, HDL, LDLCALC, TRIG, CHOLHDL, LDLDIRECT,  in the last 8760 hours  Past Procedures:     Assessment/Plan Herpes zoster Lesions opened and no s/s secondary infection. She denied pain, but her dtr and husband reported the patient could not get comfortable/figiting last night. She took Tylenol 500mg  II bid and PM II qhs at home-will resume home Tylenol therapy for now. Velcyclovir was extended to 10/09/13 due to non healing nature of the skin lesions.   Lower extremity weakness Apparently the patient was walking with walker for short distance at home prior to onset of shingles. No focal neurological deficits noted except she appears sleepy-but is easily awakened and recall my name correctly.   Hyponatremia Mild, it was 13111/5/14, then 133 10/02/13. She takes Indapamide 2.5mg . Also poor oral intake contributed to the problem. Repeat BMP in one week.   A-fib Rate controlled on Digoxin 0.125mg  and Atenolol 50mg  daily, Dig level 0.3 10/02/13  Anticoagulation goal of INR 2 to 3 For Afib. Mild supra-therapeutic INR 3.08, will continue 7.5mg  daily M-F, 7mg  Sat and Sun, repeat PT/INR in one week.   Unspecified hypothyroidism Adequately supplemented with Levothyroxine , TSH 3.573 10/02/13  Peripheral neuropathy Chronic and longstanding needle pricking pain in feet. Takes Tylenol routinely at home. Hx of Guillain Barre.     Abnormality of gait Her baseline is to ambulates with walker to/from chair to bathroom at home. Worse since onset of shingles. Here for Rehab and [place] she is able.  Urine incontinence Off Enablex since 10/02/13. The patient stated her urinary frequency and sense of incomplete emptying bladder persisted while on Enable. She has never had Urology evaluation. Enablex was stopped 10/02/13 while in SNF for further evaluation of effectiveness and possible AR     Family/ Staff Communication: observe the patient.   Goals of Care: here for Rehab and goal is to [place] [facility] with her husband.   Labs/tests ordered: BMP and PT/INR one week.

## 2013-10-03 NOTE — Assessment & Plan Note (Addendum)
Chronic and longstanding needle pricking pain in feet. Takes Tylenol routinely at home. Hx of Guillain Barre.

## 2013-10-03 NOTE — Assessment & Plan Note (Addendum)
Rate controlled on Digoxin 0.125mg and Atenolol 50mg daily, Dig level 0.3 10/02/13                 

## 2013-10-03 NOTE — Assessment & Plan Note (Signed)
Her baseline is to ambulates with walker to/from chair to bathroom at home. Worse since onset of shingles. Here for Rehab and [place] she is able.    

## 2013-10-03 NOTE — Assessment & Plan Note (Signed)
Mild, it was 13111/5/14, then 133 10/02/13. She takes Indapamide 2.5mg. Also poor oral intake contributed to the problem. Repeat BMP in one week.    

## 2013-10-03 NOTE — Assessment & Plan Note (Signed)
Adequately supplemented with Levothyroxine 125mcg, TSH 3.573 10/02/13               

## 2013-10-09 LAB — POCT INR

## 2013-10-10 ENCOUNTER — Non-Acute Institutional Stay (SKILLED_NURSING_FACILITY): Payer: Medicare Other | Admitting: Nurse Practitioner

## 2013-10-10 ENCOUNTER — Encounter: Payer: Self-pay | Admitting: Nurse Practitioner

## 2013-10-10 DIAGNOSIS — Z7901 Long term (current) use of anticoagulants: Secondary | ICD-10-CM

## 2013-10-10 DIAGNOSIS — R269 Unspecified abnormalities of gait and mobility: Secondary | ICD-10-CM

## 2013-10-10 DIAGNOSIS — I4891 Unspecified atrial fibrillation: Secondary | ICD-10-CM

## 2013-10-10 DIAGNOSIS — E871 Hypo-osmolality and hyponatremia: Secondary | ICD-10-CM

## 2013-10-10 DIAGNOSIS — E039 Hypothyroidism, unspecified: Secondary | ICD-10-CM

## 2013-10-10 DIAGNOSIS — B029 Zoster without complications: Secondary | ICD-10-CM

## 2013-10-10 DIAGNOSIS — B0229 Other postherpetic nervous system involvement: Secondary | ICD-10-CM

## 2013-10-10 DIAGNOSIS — Z5181 Encounter for therapeutic drug level monitoring: Secondary | ICD-10-CM

## 2013-10-10 NOTE — Assessment & Plan Note (Signed)
Her baseline is to ambulates with walker to/from chair to bathroom at home. Worse since onset of shingles. Here for Rehab and [place] is able.

## 2013-10-10 NOTE — Assessment & Plan Note (Signed)
Adequately supplemented with Levothyroxine 125mcg, TSH 3.573 10/02/13               

## 2013-10-10 NOTE — Assessment & Plan Note (Signed)
Therapeutic INR 2.27 10/09/13(was 3.35 10/06/13), repeat PT/INR in 3 days.

## 2013-10-10 NOTE — Assessment & Plan Note (Signed)
Lesions scabbed and no s/s secondary infection. C/o itching-Atarax prn q8h and Calamine lotion topical available to her. She c/o pain left flank--change Tylenol to prn and started Norco q6hr-helped but not adequate--increase to q4hr.  Velcyclovir was completed 10/09/13 due to non healing nature of the skin lesions.

## 2013-10-10 NOTE — Assessment & Plan Note (Signed)
Left flank--will manage with Norco 5/325 q4hr and Tylenol 650mg  q6h prn.

## 2013-10-10 NOTE — Progress Notes (Signed)
Patient ID: Shirley Mccormick, female   DOB: [birth year], 79 y.o.   MRN: [id number]  Code Status: DNR  Allergies  Allergen Reactions  . Erythromycin Rash  . Penicillins Rash  . Tetanus Toxoids Rash    Chief Complaint  Patient presents with  . Medical Managment of Chronic Issues    pain left flank, itching shingle rash, anticoagulation management.   . Acute Visit    HPI: Patient is a 79 y.o. female seen in the SNF at [facility] today for evaluation of Afib, anticoagulation management, herpetic pain, gait disorder, hyponatremia,  and other chronic medical conditions.  Problem List Items Addressed This Visit   A-fib - Primary     Rate controlled on Digoxin 0.125mg  and Atenolol 50mg  daily, Dig level 0.3 10/02/13      Abnormality of gait     Her baseline is to ambulates with walker to/from chair to bathroom at home. Worse since onset of shingles. Here for Rehab [place] is able.       Anticoagulation goal of INR 2 to 3     Therapeutic INR 2.27 10/09/13(was 3.35 10/06/13), repeat PT/INR in 3 days.     Herpes zoster     Lesions scabbed and no s/s secondary infection. C/o itching-Atarax prn q8h and Calamine lotion topical available to her. She c/o pain left flank--change Tylenol to prn and started Norco q6hr-helped but not adequate--increase to q4hr.  Velcyclovir was completed 10/09/13 due to non healing nature of the skin lesions.       Hyponatremia     Mild, it was 13111/5/14, then 133 10/02/13. She takes Indapamide 2.5mg . Also poor oral intake contributed to the problem. Repeat BMP in one week.       Postherpetic neuralgia     Left flank--will manage with Norco 5/325 q4hr and Tylenol 650mg  q6h prn.     Unspecified hypothyroidism     Adequately supplemented with Levothyroxine , TSH 3.573 10/02/13         Review of Systems:  Review of Systems  Constitutional: Negative for fever, chills, weight loss, malaise/fatigue and  diaphoresis.       Generalized and lower body weakness.   HENT: Positive for hearing loss. Negative for congestion, ear discharge, ear pain, nosebleeds, sore throat and tinnitus.   Eyes: Negative for blurred vision, double vision, photophobia, pain, discharge and redness.  Respiratory: Negative for cough, hemoptysis, sputum production, shortness of breath, wheezing and stridor.   Cardiovascular: Negative for chest pain, palpitations, orthopnea, claudication, leg swelling and PND.  Gastrointestinal: Negative for heartburn, nausea, vomiting, abdominal pain, diarrhea, constipation, blood in stool and melena.  Genitourinary: Positive for frequency. Negative for dysuria, urgency, hematuria and flank pain.  Musculoskeletal: Positive for back pain. Negative for falls, joint pain, myalgias and neck pain.       From her left shingle site travels to her left back.   Skin: Positive for rash.       Left flank and abd shingle lesions scabbed over most of the area.   Neurological: Positive for sensory change and weakness. Negative for dizziness, tingling, tremors, speech change, focal weakness, seizures, loss of consciousness and headaches.       BLE needle pricking. Left flank postherpetic neuralgia.   Endo/Heme/Allergies: Negative for environmental allergies and polydipsia. Bruises/bleeds easily.  Psychiatric/Behavioral: Positive for memory loss. Negative for depression, suicidal ideas, hallucinations and substance abuse. The patient is not nervous/anxious and does not have  insomnia.        Mild confusion occasionally.    (type .ros)  Past Medical History  Diagnosis Date  . Afib   . Shingles 10/01/13  . Pulmonary embolism 1990  . Guillain-Barre 1990    resolved  . Unspecified hypothyroidism   . Neuropathy    Past Surgical History  Procedure Laterality Date  . Breast lumpectomy Right 1965    Dr. Judithe Modest   Social History:   reports that she has never smoked. She does not have any smokeless  tobacco history on file. She reports that she does not drink alcohol or use illicit drugs.  No family history on file.  Medications: Patient's Medications  New Prescriptions   No medications on file  Previous Medications   ACETAMINOPHEN (TYLENOL) 650 MG CR TABLET    Take 650 mg by mouth every 8 (eight) hours as needed for pain.    ATENOLOL (TENORMIN) 50 MG TABLET    Take 50 mg by mouth daily.   CHOLECALCIFEROL (VITAMIN D) 1000 UNITS TABLET    Take 1,000 Units by mouth daily.   DARIFENACIN (ENABLEX) 7.5 MG 24 HR TABLET    Take 7.5 mg by mouth daily.   DIGOXIN (LANOXIN) 0.125 MG TABLET    Take 0.125 mg by mouth daily.   FISH OIL-OMEGA-3 FATTY ACIDS 1000 MG CAPSULE    Take 2 g by mouth daily.   GLUCOSAMINE-CHONDROITIN 500-400 MG TABLET    Take 1 tablet by mouth 3 (three) times daily.   INDAPAMIDE (LOZOL) 2.5 MG TABLET    Take 2.5 mg by mouth every morning.   LEVOTHYROXINE (SYNTHROID, LEVOTHROID) 125 MCG TABLET    Take 125 mcg by mouth daily.   MULTIPLE VITAMINS-MINERALS (MULTIVITAMIN WITH IRON-MINERALS) LIQUID    Take by mouth daily.   POTASSIUM CHLORIDE (K-DUR) 10 MEQ TABLET    Take 10 mEq by mouth 2 (two) times daily.   VALACYCLOVIR (VALTREX) 1000 MG TABLET       WARFARIN (COUMADIN) 7.5 MG TABLET    Take 7.5 mg by mouth daily.  Modified Medications   No medications on file  Discontinued Medications   No medications on file     Physical Exam: Physical Exam  Constitutional: She is oriented to person, place, and time. She appears well-developed and well-nourished. No distress.  HENT:  Head: Normocephalic and atraumatic.  Right Ear: External ear normal.  Left Ear: External ear normal.  Nose: Nose normal.  Mouth/Throat: Oropharynx is clear and moist. No oropharyngeal exudate.  Eyes: Conjunctivae and EOM are normal. Pupils are equal, round, and reactive to light. Right eye exhibits no discharge. Left eye exhibits no discharge. No scleral icterus.  Neck: Normal range of motion. Neck  supple. No JVD present. No tracheal deviation present. No thyromegaly present.  Cardiovascular: Normal rate, regular rhythm, normal heart sounds and intact distal pulses.   No murmur heard. Pulmonary/Chest: Effort normal and breath sounds normal. No stridor. No respiratory distress. She has no wheezes. She has no rales.  Abdominal: Soft. Bowel sounds are normal. She exhibits no distension. There is no tenderness. There is no rebound and no guarding.  Musculoskeletal: Normal range of motion. She exhibits no edema and no tenderness.  Unable to stand on her own.   Lymphadenopathy:    She has no cervical adenopathy.  Neurological: She is alert and oriented to person, place, and time. She has normal reflexes. No cranial nerve deficit. She exhibits normal muscle tone. Coordination normal.  Skin: Skin is  warm and dry. Rash noted. She is not diaphoretic. There is erythema.  Shingles left flank and abd. Opened vesicles presents w/o s/s of secondary infection.   Psychiatric: She has a normal mood and affect. Her behavior is normal. Judgment and thought content normal.    Filed Vitals:   10/10/13 1636  BP: 148/76  Pulse: 80  Temp: 97.1 F (36.2 C)  TempSrc: Tympanic  Resp: 18      Labs reviewed: Basic Metabolic Panel:  Recent Labs  40/98/11 1301 10/02/13  NA 131* 133*  K 3.7 3.7  CL 92*  --   CO2 26  --   GLUCOSE 109*  --   BUN 12 11  CREATININE 0.84 0.7  CALCIUM 9.4  --   TSH  --  3.57   Liver Function Tests:  Recent Labs  10/01/13 1301  AST 31  ALT 21  ALKPHOS 47  BILITOT 0.4  PROT 7.6  ALBUMIN 3.7   CBC:  Recent Labs  10/01/13 1301 10/02/13  WBC 5.2 5.2  NEUTROABS 3.0  --   HGB 13.5 14.0  HCT 39.3 40  MCV 87.5  --   PLT 187 210   Past Procedures:  None recently.    Assessment/Plan A-fib Rate controlled on Digoxin 0.125mg  and Atenolol 50mg  daily, Dig level 0.3 10/02/13    Abnormality of gait Her baseline is to ambulates with walker to/from chair to  bathroom at home. Worse since onset of shingles. Here for Rehab [place] is able.     Anticoagulation goal of INR 2 to 3 Therapeutic INR 2.27 10/09/13(was 3.35 10/06/13), repeat PT/INR in 3 days.   Herpes zoster Lesions scabbed and no s/s secondary infection. C/o itching-Atarax prn q8h and Calamine lotion topical available to her. She c/o pain left flank--change Tylenol to prn and started Norco q6hr-helped but not adequate--increase to q4hr.  Velcyclovir was completed 10/09/13 due to non healing nature of the skin lesions.     Postherpetic neuralgia Left flank--will manage with Norco 5/325 q4hr and Tylenol 650mg  q6h prn.   Unspecified hypothyroidism Adequately supplemented with Levothyroxine , TSH 3.573 10/02/13    Hyponatremia Mild, it was 13111/5/14, then 133 10/02/13. She takes Indapamide 2.5mg . Also poor oral intake contributed to the problem. Repeat BMP in one week.       Family/ Staff Communication: observe the patient.   Goals of Care: here for Rehab and goal [place] [facility] [place] her husband.   Labs/tests ordered: BMP pending and PT/INR next Monday

## 2013-10-10 NOTE — Assessment & Plan Note (Signed)
Rate controlled on Digoxin 0.125mg and Atenolol 50mg daily, Dig level 0.3 10/02/13                 

## 2013-10-10 NOTE — Assessment & Plan Note (Signed)
Mild, it was 13111/5/14, then 133 10/02/13. She takes Indapamide 2.5mg . Also poor oral intake contributed to the problem. Repeat BMP in one week.

## 2013-10-17 ENCOUNTER — Non-Acute Institutional Stay (SKILLED_NURSING_FACILITY): Payer: Medicare Other | Admitting: Nurse Practitioner

## 2013-10-17 ENCOUNTER — Encounter: Payer: Self-pay | Admitting: Nurse Practitioner

## 2013-10-17 DIAGNOSIS — R413 Other amnesia: Secondary | ICD-10-CM

## 2013-10-17 DIAGNOSIS — Z7901 Long term (current) use of anticoagulants: Secondary | ICD-10-CM

## 2013-10-17 DIAGNOSIS — B029 Zoster without complications: Secondary | ICD-10-CM

## 2013-10-17 DIAGNOSIS — Z5181 Encounter for therapeutic drug level monitoring: Secondary | ICD-10-CM

## 2013-10-17 DIAGNOSIS — E871 Hypo-osmolality and hyponatremia: Secondary | ICD-10-CM

## 2013-10-17 DIAGNOSIS — B0229 Other postherpetic nervous system involvement: Secondary | ICD-10-CM

## 2013-10-17 DIAGNOSIS — I4891 Unspecified atrial fibrillation: Secondary | ICD-10-CM

## 2013-10-17 DIAGNOSIS — E039 Hypothyroidism, unspecified: Secondary | ICD-10-CM

## 2013-10-17 DIAGNOSIS — R269 Unspecified abnormalities of gait and mobility: Secondary | ICD-10-CM

## 2013-10-17 NOTE — Assessment & Plan Note (Signed)
Adequately supplemented with Levothyroxine 125mcg, TSH 3.573 10/02/13               

## 2013-10-17 NOTE — Assessment & Plan Note (Signed)
Rate controlled on Digoxin 0.125mg and Atenolol 50mg daily, Dig level 0.3 10/02/13                 

## 2013-10-17 NOTE — Assessment & Plan Note (Signed)
Update BMP.  

## 2013-10-17 NOTE — Assessment & Plan Note (Signed)
MMSE 

## 2013-10-17 NOTE — Assessment & Plan Note (Signed)
Skin eruptions healing nicely.

## 2013-10-17 NOTE — Progress Notes (Signed)
Patient ID: Shirley Mccormick, female   DOB: Mar 30, 1925, 79 y.o.   MRN: [id number]  Code Status: DNR  Allergies  Allergen Reactions  . Erythromycin Rash  . Penicillins Rash  . Tetanus Toxoids Rash    Chief Complaint  Patient presents with  . Medical Managment of Chronic Issues    postherpetic pain, generalized weakness-gait disorder.   . Acute Visit    HPI: Patient is a 79 y.o. female seen in the SNF at [facility] today for evaluation of Afib, anticoagulation management, herpetic pain, gait disorder, hyponatremia, memory lapse, and other chronic medical conditions.  Problem List Items Addressed This Visit   A-fib - Primary     Rate controlled on Digoxin 0.125mg  and Atenolol 50mg  daily, Dig level 0.3 10/02/13        Abnormality of gait     Here for Rehab--still unable to walk.     Anticoagulation goal of INR 2 to 3     Therapeutic INR presently      Herpes zoster     Skin eruptions healing nicely.     Hyponatremia     Update BMP    Memory impairment     MMSE    Postherpetic neuralgia     Started Gabapentin 100mg  bid since 10/14/13 for pain--helped but not adequate--no increased drowsiness--will increase to 100mg  tid for better pain control. Continue Hydrocodone [id number] for now.     Relevant Medications      gabapentin (NEURONTIN) 100 MG capsule   Unspecified hypothyroidism     Adequately supplemented with Levothyroxine , TSH 3.573 10/02/13           Review of Systems:  Review of Systems  Constitutional: Negative for fever, chills, weight loss, malaise/fatigue and diaphoresis.       Generalized and lower body weakness.   HENT: Positive for hearing loss. Negative for congestion, ear discharge, ear pain, nosebleeds, sore throat and tinnitus.   Eyes: Negative for blurred vision, double vision, photophobia, pain, discharge and redness.  Respiratory: Negative for cough, hemoptysis, sputum production, shortness of breath, wheezing and stridor.    Cardiovascular: Negative for chest pain, palpitations, orthopnea, claudication, leg swelling and PND.  Gastrointestinal: Negative for heartburn, nausea, vomiting, abdominal pain, diarrhea, constipation, blood in stool and melena.  Genitourinary: Positive for frequency. Negative for dysuria, urgency, hematuria and flank pain.  Musculoskeletal: Positive for back pain. Negative for falls, joint pain, myalgias and neck pain.       From her left shingle site travels to her left back.   Skin: Positive for rash.       Left flank and abd shingle lesions scabbed over most of the area.   Neurological: Positive for sensory change and weakness. Negative for dizziness, tingling, tremors, speech change, focal weakness, seizures, loss of consciousness and headaches.       BLE needle pricking. Left flank postherpetic neuralgia.   Endo/Heme/Allergies: Negative for environmental allergies and polydipsia. Bruises/bleeds easily.  Psychiatric/Behavioral: Positive for memory loss. Negative for depression, suicidal ideas, hallucinations and substance abuse. The patient is not nervous/anxious and does not have insomnia.        Mild confusion occasionally.    (type .ros)  Past Medical History  Diagnosis Date  . Afib   . Shingles 10/01/13  . Pulmonary embolism 1990  . Guillain-Barre 1990    resolved  . Unspecified hypothyroidism   . Neuropathy    Past Surgical History  Procedure Laterality Date  . Breast lumpectomy Right 1965  Dr. Judithe Modest   Social History:   reports that she has never smoked. She does not have any smokeless tobacco history on file. She reports that she does not drink alcohol or use illicit drugs.  No family history on file.  Medications: Patient's Medications  New Prescriptions   No medications on file  Previous Medications   ACETAMINOPHEN (TYLENOL) 650 MG CR TABLET    Take 650 mg by mouth every 8 (eight) hours as needed for pain.    ATENOLOL (TENORMIN) 50 MG TABLET    Take 50  mg by mouth daily.   CHOLECALCIFEROL (VITAMIN D) 1000 UNITS TABLET    Take 1,000 Units by mouth daily.   DARIFENACIN (ENABLEX) 7.5 MG 24 HR TABLET    Take 7.5 mg by mouth daily.   DIGOXIN (LANOXIN) 0.125 MG TABLET    Take 0.125 mg by mouth daily.   FISH OIL-OMEGA-3 FATTY ACIDS 1000 MG CAPSULE    Take 2 g by mouth daily.   GABAPENTIN (NEURONTIN) 100 MG CAPSULE    Take 100 mg by mouth 3 (three) times daily.   GLUCOSAMINE-CHONDROITIN 500-400 MG TABLET    Take 1 tablet by mouth 3 (three) times daily.   INDAPAMIDE (LOZOL) 2.5 MG TABLET    Take 2.5 mg by mouth every morning.   LEVOTHYROXINE (SYNTHROID, LEVOTHROID) 125 MCG TABLET    Take 125 mcg by mouth daily.   MULTIPLE VITAMINS-MINERALS (MULTIVITAMIN WITH IRON-MINERALS) LIQUID    Take by mouth daily.   POTASSIUM CHLORIDE (K-DUR) 10 MEQ TABLET    Take 10 mEq by mouth 2 (two) times daily.   VALACYCLOVIR (VALTREX) 1000 MG TABLET       WARFARIN (COUMADIN) 7.5 MG TABLET    Take 7.5 mg by mouth daily.  Modified Medications   No medications on file  Discontinued Medications   No medications on file     Physical Exam: Physical Exam  Constitutional: She is oriented to person, place, and time. She appears well-developed and well-nourished. No distress.  HENT:  Head: Normocephalic and atraumatic.  Right Ear: External ear normal.  Left Ear: External ear normal.  Nose: Nose normal.  Mouth/Throat: Oropharynx is clear and moist. No oropharyngeal exudate.  Eyes: Conjunctivae and EOM are normal. Pupils are equal, round, and reactive to light. Right eye exhibits no discharge. Left eye exhibits no discharge. No scleral icterus.  Neck: Normal range of motion. Neck supple. No JVD present. No tracheal deviation present. No thyromegaly present.  Cardiovascular: Normal rate, regular rhythm, normal heart sounds and intact distal pulses.   No murmur heard. Pulmonary/Chest: Effort normal and breath sounds normal. No stridor. No respiratory distress. She has no  wheezes. She has no rales.  Abdominal: Soft. Bowel sounds are normal. She exhibits no distension. There is no tenderness. There is no rebound and no guarding.  Musculoskeletal: Normal range of motion. She exhibits no edema and no tenderness.  Unable to stand on her own.   Lymphadenopathy:    She has no cervical adenopathy.  Neurological: She is alert and oriented to person, place, and time. She has normal reflexes. No cranial nerve deficit. She exhibits normal muscle tone. Coordination normal.  Skin: Skin is warm and dry. Rash noted. She is not diaphoretic. There is erythema.  Shingles left flank and abd. Opened vesicles presents w/o s/s of secondary infection.   Psychiatric: She has a normal mood and affect. Her behavior is normal. Judgment and thought content normal.    Filed Vitals:  10/17/13 1339  BP: 142/70  Pulse: 64  Temp: 97.8 F (36.6 C)  TempSrc: Tympanic  Resp: 18      Labs reviewed: Basic Metabolic Panel:  Recent Labs  81/19/14 1301 10/02/13  NA 131* 133*  K 3.7 3.7  CL 92*  --   CO2 26  --   GLUCOSE 109*  --   BUN 12 11  CREATININE 0.84 0.7  CALCIUM 9.4  --   TSH  --  3.57   Liver Function Tests:  Recent Labs  10/01/13 1301  AST 31  ALT 21  ALKPHOS 47  BILITOT 0.4  PROT 7.6  ALBUMIN 3.7   CBC:  Recent Labs  10/01/13 1301 10/02/13  WBC 5.2 5.2  NEUTROABS 3.0  --   HGB 13.5 14.0  HCT 39.3 40  MCV 87.5  --   PLT 187 210   Past Procedures:  None recently.    Assessment/Plan A-fib Rate controlled on Digoxin 0.125mg  and Atenolol 50mg  daily, Dig level 0.3 10/02/13      Anticoagulation goal of INR 2 to 3 Therapeutic INR presently    Abnormality of gait Here for Rehab--still unable to walk.   Herpes zoster Skin eruptions healing nicely.   Hyponatremia Update BMP  Postherpetic neuralgia Started Gabapentin 100mg  bid since 10/14/13 for pain--helped but not adequate--no increased drowsiness--will increase to 100mg  tid for  better pain control. Continue Hydrocodone [id number] for now.   Unspecified hypothyroidism Adequately supplemented with Levothyroxine , TSH 3.573 10/02/13      Memory impairment MMSE    Family/ Staff Communication: observe the patient. MMSE  Goals of Care: here for Rehab and [place] [facility] [place] her [place].   Labs/tests ordered: BMP

## 2013-10-17 NOTE — Assessment & Plan Note (Signed)
Started Gabapentin 100mg  bid since 10/14/13 for pain--helped but not adequate--no increased drowsiness--will increase to 100mg  tid for better pain control. Continue Hydrocodone [id number] for now.

## 2013-10-17 NOTE — Assessment & Plan Note (Signed)
Therapeutic INR presently

## 2013-10-17 NOTE — Assessment & Plan Note (Signed)
Here for Rehab--still unable to walk.

## 2013-10-27 LAB — BASIC METABOLIC PANEL
BUN: 16 mg/dL (ref 4–21)
Glucose: 96 mg/dL
Potassium: 3.6 mmol/L (ref 3.4–5.3)
Sodium: 131 mmol/L — AB (ref 137–147)

## 2013-10-27 LAB — POCT INR

## 2013-10-28 ENCOUNTER — Encounter: Payer: Self-pay | Admitting: Nurse Practitioner

## 2013-10-28 ENCOUNTER — Non-Acute Institutional Stay (SKILLED_NURSING_FACILITY): Payer: Medicare Other | Admitting: Nurse Practitioner

## 2013-10-28 DIAGNOSIS — G609 Hereditary and idiopathic neuropathy, unspecified: Secondary | ICD-10-CM

## 2013-10-28 DIAGNOSIS — G629 Polyneuropathy, unspecified: Secondary | ICD-10-CM

## 2013-10-28 DIAGNOSIS — E039 Hypothyroidism, unspecified: Secondary | ICD-10-CM

## 2013-10-28 DIAGNOSIS — H6123 Impacted cerumen, bilateral: Secondary | ICD-10-CM

## 2013-10-28 DIAGNOSIS — E871 Hypo-osmolality and hyponatremia: Secondary | ICD-10-CM

## 2013-10-28 DIAGNOSIS — I4891 Unspecified atrial fibrillation: Secondary | ICD-10-CM

## 2013-10-28 DIAGNOSIS — K625 Hemorrhage of anus and rectum: Secondary | ICD-10-CM

## 2013-10-28 DIAGNOSIS — B0229 Other postherpetic nervous system involvement: Secondary | ICD-10-CM

## 2013-10-28 DIAGNOSIS — H6120 Impacted cerumen, unspecified ear: Secondary | ICD-10-CM

## 2013-10-28 DIAGNOSIS — R609 Edema, unspecified: Secondary | ICD-10-CM

## 2013-10-28 DIAGNOSIS — R062 Wheezing: Secondary | ICD-10-CM | POA: Insufficient documentation

## 2013-10-28 NOTE — Assessment & Plan Note (Signed)
Started Gabapentin 100mg  bid since 10/14/13 for pain--helped but not adequate--better controlled with Gabapentin 100mg  tid and able to decrease Hydrocodone to q6hr

## 2013-10-28 NOTE — Assessment & Plan Note (Signed)
Adequately supplemented with Levothyroxine 125mcg, TSH 3.573 10/02/13               

## 2013-10-28 NOTE — Assessment & Plan Note (Signed)
Chronic and longstanding needle pricking pain in feet. Takes Tylenol routinely at home. Hx of Guillain Barre.   

## 2013-10-28 NOTE — Progress Notes (Signed)
Patient ID: Shirley Mccormick, female   DOB: 26-Feb-1925, 79 y.o.   MRN: [id number]  Code Status: DNR  Allergies  Allergen Reactions  . Erythromycin Rash  . Penicillins Rash  . Tetanus Toxoids Rash    Chief Complaint  Patient presents with  . Medical Managment of Chronic Issues    BLE edema, wheezes, blood in stool  . Acute Visit    HPI: Patient is a 79 y.o. female seen in the SNF at [facility] today for evaluation of Afib, anticoagulation management, herpetic pain, gait disorder, fall, BLE edeam, hyponatremia, memory lapse, and other chronic medical conditions.  Problem List Items Addressed This Visit   Wheezes     Mild diffused expiratory wheeze. The patient c/o some hacking cough. Denied colored sputum production or chest  Pain or SOB. ?developing CHF since the BLE worse. Adding Spironolactone 25mg  to Lozol 2.5mg  daily. Neb tx tid for 3 days. CXR to evaluate    Unspecified hypothyroidism     Adequately supplemented with Levothyroxine , TSH 3.573 10/02/13          Rectal bleeding     Staff reported small amount of blood in stool 10/22/13. No external hemorrhoids noted. Will Hemoccult stool x3 and f/u CBC    Postherpetic neuralgia     Started Gabapentin 100mg  bid since 10/14/13 for pain--helped but not adequate--better controlled with Gabapentin 100mg  tid and able to decrease Hydrocodone to q6hr      Peripheral neuropathy     Chronic and longstanding needle pricking pain in feet. Takes Tylenol routinely at home. Hx of Guillain Barre.        Impacted cerumen of both ears     Ear lavage follow facility standing order.     Hyponatremia     Last serum Na 13112/1/14      Edema     BLE--takes Lozol 2.5mg  and Kcl bid. Will add Spironolactone 25mg  daily. Observe the patient. Update BMPBNP next lab.     A-fib - Primary     Rate controlled on Digoxin 0.125mg  and Atenolol 50mg  daily, Dig level 0.3 10/02/13          Relevant Medications   spironolactone (ALDACTONE) 25 MG tablet      Review of Systems:  Review of Systems  Constitutional: Negative for fever, chills, weight loss, malaise/fatigue and diaphoresis.       Generalized and lower body weakness.   HENT: Positive for hearing loss. Negative for congestion, ear discharge, ear pain, nosebleeds, sore throat and tinnitus.   Eyes: Negative for blurred vision, double vision, photophobia, pain, discharge and redness.  Respiratory: Negative for cough, hemoptysis, sputum production, shortness of breath, wheezing and stridor.   Cardiovascular: Positive for leg swelling. Negative for chest pain, palpitations, orthopnea, claudication and PND.       BLE  Gastrointestinal: Negative for heartburn, nausea, vomiting, abdominal pain, diarrhea, constipation, blood in stool and melena.  Genitourinary: Positive for frequency. Negative for dysuria, urgency, hematuria and flank pain.  Musculoskeletal: Positive for back pain. Negative for falls, joint pain, myalgias and neck pain.       From her left shingle site travels to her left back.   Skin: Positive for rash.       Left flank and abd shingle lesions healing nicely  Neurological: Positive for sensory change and weakness. Negative for dizziness, tingling, tremors, speech change, focal weakness, seizures, loss of consciousness and headaches.       BLE needle pricking. Left  flank postherpetic neuralgia.   Endo/Heme/Allergies: Negative for environmental allergies and polydipsia. Bruises/bleeds easily.  Psychiatric/Behavioral: Positive for memory loss. Negative for depression, suicidal ideas, hallucinations and substance abuse. The patient is not nervous/anxious and does not have insomnia.        Mild confusion occasionally.    (type .ros)  Past Medical History  Diagnosis Date  . Afib   . Shingles 10/01/13  . Pulmonary embolism 1990  . Guillain-Barre 1990    resolved  . Unspecified hypothyroidism   . Neuropathy    Past Surgical  History  Procedure Laterality Date  . Breast lumpectomy Right 1965    Dr. Judithe Modest   Social History:   reports that she has never smoked. She does not have any smokeless tobacco history on file. She reports that she does not drink alcohol or use illicit drugs.  Medications: Patient's Medications  New Prescriptions   No medications on file  Previous Medications   ACETAMINOPHEN (TYLENOL) 650 MG CR TABLET    Take 650 mg by mouth every 8 (eight) hours as needed for pain.    ATENOLOL (TENORMIN) 50 MG TABLET    Take 50 mg by mouth daily.   CHOLECALCIFEROL (VITAMIN D) 1000 UNITS TABLET    Take 1,000 Units by mouth daily.   DARIFENACIN (ENABLEX) 7.5 MG 24 HR TABLET    Take 7.5 mg by mouth daily.   DIGOXIN (LANOXIN) 0.125 MG TABLET    Take 0.125 mg by mouth daily.   FISH OIL-OMEGA-3 FATTY ACIDS 1000 MG CAPSULE    Take 2 g by mouth daily.   GABAPENTIN (NEURONTIN) 100 MG CAPSULE    Take 100 mg by mouth 3 (three) times daily.   GLUCOSAMINE-CHONDROITIN 500-400 MG TABLET    Take 1 tablet by mouth 3 (three) times daily.   INDAPAMIDE (LOZOL) 2.5 MG TABLET    Take 2.5 mg by mouth every morning.   LEVOTHYROXINE (SYNTHROID, LEVOTHROID) 125 MCG TABLET    Take 125 mcg by mouth daily.   MULTIPLE VITAMINS-MINERALS (MULTIVITAMIN WITH IRON-MINERALS) LIQUID    Take by mouth daily.   POTASSIUM CHLORIDE (K-DUR) 10 MEQ TABLET    Take 10 mEq by mouth 2 (two) times daily.   SPIRONOLACTONE (ALDACTONE) 25 MG TABLET    Take 25 mg by mouth daily.   VALACYCLOVIR (VALTREX) 1000 MG TABLET       WARFARIN (COUMADIN) 7.5 MG TABLET    Take 7.5 mg by mouth daily.  Modified Medications   No medications on file  Discontinued Medications   No medications on file     Physical Exam: Physical Exam  Constitutional: She is oriented to person, place, and time. She appears well-developed and well-nourished. No distress.  HENT:  Head: Normocephalic and atraumatic.  Right Ear: External ear normal.  Left Ear: External ear  normal.  Nose: Nose normal.  Mouth/Throat: Oropharynx is clear and moist. No oropharyngeal exudate.  Eyes: Conjunctivae and EOM are normal. Pupils are equal, round, and reactive to light. Right eye exhibits no discharge. Left eye exhibits no discharge. No scleral icterus.  Neck: Normal range of motion. Neck supple. No JVD present. No tracheal deviation present. No thyromegaly present.  Cardiovascular: Normal rate, regular rhythm, normal heart sounds and intact distal pulses.   No murmur heard. Pulmonary/Chest: Effort normal and breath sounds normal. No stridor. No respiratory distress. She has no wheezes. She has no rales.  Abdominal: Soft. Bowel sounds are normal. She exhibits no distension. There is no tenderness. There is  no rebound and no guarding.  Musculoskeletal: Normal range of motion. She exhibits edema. She exhibits no tenderness.  Unable to stand on her own. BLE edema 2+  Lymphadenopathy:    She has no cervical adenopathy.  Neurological: She is alert and oriented to person, place, and time. She has normal reflexes. No cranial nerve deficit. She exhibits normal muscle tone. Coordination normal.  Skin: Skin is warm and dry. Rash noted. She is not diaphoretic. There is erythema.  Shingles left flank and abd-healing nicely.   Psychiatric: She has a normal mood and affect. Her behavior is normal. Judgment and thought content normal.    Filed Vitals:   10/28/13 1540  BP: 158/78  Pulse: 82  Temp: 98 F (36.7 C)  TempSrc: Tympanic  Resp: 20      Labs reviewed: Basic Metabolic Panel:  Recent Labs  56/21/30 1301 10/02/13 10/27/13  NA 131* 133* 131*  K 3.7 3.7 3.6  CL 92*  --   --   CO2 26  --   --   GLUCOSE 109*  --   --   BUN 12 11 16   CREATININE 0.84 0.7 0.8  CALCIUM 9.4  --   --   TSH  --  3.57  --    Liver Function Tests:  Recent Labs  10/01/13 1301  AST 31  ALT 21  ALKPHOS 47  BILITOT 0.4  PROT 7.6  ALBUMIN 3.7   CBC:  Recent Labs  10/01/13 1301  10/02/13  WBC 5.2 5.2  NEUTROABS 3.0  --   HGB 13.5 14.0  HCT 39.3 40  MCV 87.5  --   PLT 187 210   Past Procedures:  None recently.    Assessment/Plan A-fib Rate controlled on Digoxin 0.125mg  and Atenolol 50mg  daily, Dig level 0.3 10/02/13        Hyponatremia Last serum Na 13112/1/14    Edema BLE--takes Lozol 2.5mg  and Kcl bid. Will add Spironolactone 25mg  daily. Observe the patient. Update BMPBNP next lab.   Wheezes Mild diffused expiratory wheeze. The patient c/o some hacking cough. Denied colored sputum production or chest  Pain or SOB. ?developing CHF since the BLE worse. Adding Spironolactone 25mg  to Lozol 2.5mg  daily. Neb tx tid for 3 days. CXR to evaluate  Impacted cerumen of both ears Ear lavage follow facility standing order.   Rectal bleeding Staff reported small amount of blood in stool 10/22/13. No external hemorrhoids noted. Will Hemoccult stool x3 and f/u CBC  Unspecified hypothyroidism Adequately supplemented with Levothyroxine , TSH 3.573 10/02/13        Postherpetic neuralgia Started Gabapentin 100mg  bid since 10/14/13 for pain--helped but not adequate--better controlled with Gabapentin 100mg  tid and able to decrease Hydrocodone to q6hr    Peripheral neuropathy Chronic and longstanding needle pricking pain in feet. Takes Tylenol routinely at home. Hx of Guillain Barre.        Family/ Staff Communication: observe the patient. MMSE  Goals of Care: here for Rehab and [place] [facility] [place].   Labs/tests ordered: BMP CBC BNP and CXR

## 2013-10-28 NOTE — Assessment & Plan Note (Signed)
Staff reported small amount of blood in stool 10/22/13. No external hemorrhoids noted. Will Hemoccult stool x3 and f/u CBC

## 2013-10-28 NOTE — Assessment & Plan Note (Signed)
Mild diffused expiratory wheeze. The patient c/o some hacking cough. Denied colored sputum production or chest  Pain or SOB. ?developing CHF since the BLE worse. Adding Spironolactone 25mg  to Lozol 2.5mg  daily. Neb tx tid for 3 days. CXR to evaluate

## 2013-10-28 NOTE — Assessment & Plan Note (Signed)
Rate controlled on Digoxin 0.125mg and Atenolol 50mg daily, Dig level 0.3 10/02/13                 

## 2013-10-28 NOTE — Assessment & Plan Note (Signed)
Ear lavage follow facility standing order.

## 2013-10-28 NOTE — Assessment & Plan Note (Signed)
Last serum Na 13112/1/14

## 2013-10-28 NOTE — Assessment & Plan Note (Signed)
BLE--takes Lozol 2.5mg  and Kcl bid. Will add Spironolactone 25mg  daily. Observe the patient. Update BMPBNP next lab.

## 2013-10-31 ENCOUNTER — Non-Acute Institutional Stay (SKILLED_NURSING_FACILITY): Payer: Medicare Other | Admitting: Nurse Practitioner

## 2013-10-31 ENCOUNTER — Emergency Department (HOSPITAL_COMMUNITY): Payer: Medicare Other

## 2013-10-31 ENCOUNTER — Inpatient Hospital Stay (HOSPITAL_COMMUNITY)
Admission: EM | Admit: 2013-10-31 | Discharge: 2013-11-11 | DRG: 643 | Disposition: A | Discharge status: Skilled Nursing Facility | Payer: Medicare Other | Attending: Internal Medicine | Admitting: Internal Medicine

## 2013-10-31 ENCOUNTER — Encounter: Payer: Self-pay | Admitting: Nurse Practitioner

## 2013-10-31 ENCOUNTER — Encounter (HOSPITAL_COMMUNITY): Payer: Self-pay | Admitting: Emergency Medicine

## 2013-10-31 DIAGNOSIS — E871 Hypo-osmolality and hyponatremia: Secondary | ICD-10-CM

## 2013-10-31 DIAGNOSIS — K5100 Ulcerative (chronic) pancolitis without complications: Secondary | ICD-10-CM | POA: Diagnosis present

## 2013-10-31 DIAGNOSIS — Z887 Allergy status to serum and vaccine status: Secondary | ICD-10-CM

## 2013-10-31 DIAGNOSIS — Z5181 Encounter for therapeutic drug level monitoring: Secondary | ICD-10-CM

## 2013-10-31 DIAGNOSIS — Y9210 Unspecified residential institution as the place of occurrence of the external cause: Secondary | ICD-10-CM | POA: Diagnosis present

## 2013-10-31 DIAGNOSIS — I10 Essential (primary) hypertension: Secondary | ICD-10-CM

## 2013-10-31 DIAGNOSIS — K529 Noninfective gastroenteritis and colitis, unspecified: Secondary | ICD-10-CM

## 2013-10-31 DIAGNOSIS — E876 Hypokalemia: Secondary | ICD-10-CM

## 2013-10-31 DIAGNOSIS — B0229 Other postherpetic nervous system involvement: Secondary | ICD-10-CM | POA: Diagnosis present

## 2013-10-31 DIAGNOSIS — W19XXXA Unspecified fall, initial encounter: Secondary | ICD-10-CM

## 2013-10-31 DIAGNOSIS — Z7901 Long term (current) use of anticoagulants: Secondary | ICD-10-CM

## 2013-10-31 DIAGNOSIS — E861 Hypovolemia: Secondary | ICD-10-CM | POA: Diagnosis present

## 2013-10-31 DIAGNOSIS — Z66 Do not resuscitate: Secondary | ICD-10-CM | POA: Diagnosis present

## 2013-10-31 DIAGNOSIS — E039 Hypothyroidism, unspecified: Secondary | ICD-10-CM

## 2013-10-31 DIAGNOSIS — G9341 Metabolic encephalopathy: Secondary | ICD-10-CM | POA: Diagnosis present

## 2013-10-31 DIAGNOSIS — R1112 Projectile vomiting: Secondary | ICD-10-CM | POA: Insufficient documentation

## 2013-10-31 DIAGNOSIS — S0003XA Contusion of scalp, initial encounter: Secondary | ICD-10-CM

## 2013-10-31 DIAGNOSIS — E236 Other disorders of pituitary gland: Principal | ICD-10-CM | POA: Diagnosis present

## 2013-10-31 DIAGNOSIS — S0990XA Unspecified injury of head, initial encounter: Secondary | ICD-10-CM

## 2013-10-31 DIAGNOSIS — R413 Other amnesia: Secondary | ICD-10-CM

## 2013-10-31 DIAGNOSIS — R32 Unspecified urinary incontinence: Secondary | ICD-10-CM | POA: Diagnosis present

## 2013-10-31 DIAGNOSIS — I4891 Unspecified atrial fibrillation: Secondary | ICD-10-CM

## 2013-10-31 DIAGNOSIS — I509 Heart failure, unspecified: Secondary | ICD-10-CM

## 2013-10-31 DIAGNOSIS — G629 Polyneuropathy, unspecified: Secondary | ICD-10-CM

## 2013-10-31 DIAGNOSIS — G609 Hereditary and idiopathic neuropathy, unspecified: Secondary | ICD-10-CM

## 2013-10-31 DIAGNOSIS — R112 Nausea with vomiting, unspecified: Secondary | ICD-10-CM

## 2013-10-31 DIAGNOSIS — R609 Edema, unspecified: Secondary | ICD-10-CM

## 2013-10-31 DIAGNOSIS — I428 Other cardiomyopathies: Secondary | ICD-10-CM | POA: Diagnosis present

## 2013-10-31 DIAGNOSIS — W19XXXD Unspecified fall, subsequent encounter: Secondary | ICD-10-CM

## 2013-10-31 DIAGNOSIS — D638 Anemia in other chronic diseases classified elsewhere: Secondary | ICD-10-CM | POA: Insufficient documentation

## 2013-10-31 DIAGNOSIS — K6389 Other specified diseases of intestine: Secondary | ICD-10-CM

## 2013-10-31 DIAGNOSIS — K5289 Other specified noninfective gastroenteritis and colitis: Secondary | ICD-10-CM

## 2013-10-31 DIAGNOSIS — R1115 Cyclical vomiting syndrome unrelated to migraine: Secondary | ICD-10-CM

## 2013-10-31 DIAGNOSIS — F0390 Unspecified dementia without behavioral disturbance: Secondary | ICD-10-CM | POA: Diagnosis present

## 2013-10-31 DIAGNOSIS — Z880 Allergy status to penicillin: Secondary | ICD-10-CM

## 2013-10-31 DIAGNOSIS — L0390 Cellulitis, unspecified: Secondary | ICD-10-CM

## 2013-10-31 LAB — POCT I-STAT, CHEM 8
BUN: 10 mg/dL (ref 6–23)
Calcium, Ion: 1.08 mmol/L — ABNORMAL LOW (ref 1.13–1.30)
Chloride: 85 mEq/L — ABNORMAL LOW (ref 96–112)
Creatinine, Ser: 0.8 mg/dL (ref 0.50–1.10)
Potassium: 3.3 mEq/L — ABNORMAL LOW (ref 3.5–5.1)
TCO2: 26 mmol/L (ref 0–100)

## 2013-10-31 LAB — CBC WITH DIFFERENTIAL/PLATELET
Basophils Absolute: 0 10*3/uL (ref 0.0–0.1)
Basophils Relative: 0 % (ref 0–1)
Eosinophils Absolute: 0 10*3/uL (ref 0.0–0.7)
Eosinophils Relative: 1 % (ref 0–5)
HCT: 35 % — ABNORMAL LOW (ref 36.0–46.0)
Hemoglobin: 12.4 g/dL (ref 12.0–15.0)
MCH: 31.2 pg (ref 26.0–34.0)
MCHC: 35.4 g/dL (ref 30.0–36.0)
MCV: 88.2 fL (ref 78.0–100.0)
Monocytes Absolute: 1.1 10*3/uL — ABNORMAL HIGH (ref 0.1–1.0)
Monocytes Relative: 15 % — ABNORMAL HIGH (ref 3–12)
Neutro Abs: 5.2 10*3/uL (ref 1.7–7.7)
Neutrophils Relative %: 70 % (ref 43–77)
RDW: 14.2 % (ref 11.5–15.5)

## 2013-10-31 LAB — CBC AND DIFFERENTIAL: Platelets: 216 10*3/uL (ref 150–399)

## 2013-10-31 LAB — URINALYSIS, ROUTINE W REFLEX MICROSCOPIC
Bilirubin Urine: NEGATIVE
Glucose, UA: NEGATIVE mg/dL
Hgb urine dipstick: NEGATIVE
Ketones, ur: NEGATIVE mg/dL
Nitrite: NEGATIVE
Protein, ur: NEGATIVE mg/dL
Urobilinogen, UA: 0.2 mg/dL (ref 0.0–1.0)

## 2013-10-31 LAB — PROTIME-INR
INR: 1.7 — ABNORMAL HIGH (ref 0.00–1.49)
Prothrombin Time: 19.5 seconds — ABNORMAL HIGH (ref 11.6–15.2)

## 2013-10-31 LAB — CG4 I-STAT (LACTIC ACID): Lactic Acid, Venous: 1.57 mmol/L (ref 0.5–2.2)

## 2013-10-31 LAB — BASIC METABOLIC PANEL
Creatinine: 0.8 mg/dL (ref 0.5–1.1)
Glucose: 91 mg/dL
Potassium: 4.6 mmol/L (ref 3.4–5.3)

## 2013-10-31 LAB — APTT: aPTT: 40 seconds — ABNORMAL HIGH (ref 24–37)

## 2013-10-31 LAB — POCT I-STAT TROPONIN I: Troponin i, poc: 0.03 ng/mL (ref 0.00–0.08)

## 2013-10-31 MED ORDER — SODIUM CHLORIDE 0.9 % IV SOLN: Freq: Once | INTRAVENOUS | Status: DC

## 2013-10-31 MED ORDER — ALBUTEROL SULFATE (5 MG/ML) 0.5% IN NEBU
5.0000 mg | INHALATION_SOLUTION | Freq: Once | RESPIRATORY_TRACT | Status: AC
  Administered 2013-10-31: 5 mg via RESPIRATORY_TRACT
  Filled 2013-10-31: qty 1

## 2013-10-31 MED ORDER — ONDANSETRON HCL 4 MG/2ML IJ SOLN
4.0000 mg | Freq: Once | INTRAMUSCULAR | Status: DC
  Filled 2013-10-31: qty 2

## 2013-10-31 MED ORDER — SODIUM CHLORIDE 0.9 % IV SOLN
Freq: Once | INTRAVENOUS | Status: AC
  Administered 2013-10-31: 20:00:00 via INTRAVENOUS

## 2013-10-31 MED ORDER — IOHEXOL 300 MG/ML  SOLN
100.0000 mL | Freq: Once | INTRAMUSCULAR | Status: AC | PRN
  Administered 2013-10-31: 100 mL via INTRAVENOUS

## 2013-10-31 NOTE — Assessment & Plan Note (Signed)
INR 3.56 10/27/13-Coumadin held 10/27/13-decreased to 6mg  daily, repeat PT/INR in one week scheduled from 10/27/13

## 2013-10-31 NOTE — ED Notes (Signed)
Pt arrives [facility] from [facility] for a fall 3 days ago. According to EMS, EMS was called out 3 days ago, but was not transported to ED. Projectile emesis this AM.

## 2013-10-31 NOTE — Assessment & Plan Note (Signed)
BLE--takes Lozol 2.5mg  and Kcl bid. added Spironolactone 25mg  daily. K 4.6, Bun/creat 12/0.77, BNP 264.1 10/31/13

## 2013-10-31 NOTE — ED Notes (Signed)
Pt. In radiology 

## 2013-10-31 NOTE — Assessment & Plan Note (Signed)
Started Gabapentin 100mg bid since 10/14/13 for pain--helped but not adequate--better controlled with Gabapentin 100mg tid and able to decrease Hydrocodone to q6hr   

## 2013-10-31 NOTE — Assessment & Plan Note (Signed)
MMSE 20/30 10/2013

## 2013-10-31 NOTE — Assessment & Plan Note (Addendum)
Denied nausea, stomach pain, indigestion, or diarrhea. Last large BM 10/29/13-no fecal impaction per digital check. Urine dark but not cloudy-urine culture sample obtained. She is afebrile. No focal neurological deficits noted. ER evaluation.

## 2013-10-31 NOTE — Assessment & Plan Note (Signed)
Larey Seat 10/28/13 in her bathroom while taking herself to the commode w/o calling for assistance. Sustained a large left forehead bruise. Nero check was intact. Projectile vomiting x1 10/30/13 and x1 10/31/13. Last large BM was 10/29/13. Urine in dark color-but not cloudy or odorous. She is afebrile.

## 2013-10-31 NOTE — Assessment & Plan Note (Signed)
Last serum Na 13112/1/14 then 130 10/31/13

## 2013-10-31 NOTE — Progress Notes (Signed)
Patient ID: Shirley Mccormick, female   DOB: 1925/07/27, 79 y.o.   MRN: [id number]   Code Status: DNR  Allergies  Allergen Reactions  . Erythromycin Rash  . Penicillins Rash  . Tetanus Toxoids Rash    Chief Complaint  Patient presents with  . Medical Managment of Chronic Issues    projectile vomiting x2, head contusion 10/28/13, Hgb dropped from 14.0 10/02/13 to 11.9 10/31/13  . Acute Visit    HPI: Patient is a 79 y.o. female seen in the SNF at [facility] today for evaluation of projectile vomiting x2, head trauma, Hgb dropped to 11.9 from 14.0 in one month,  and other chronic medical conditions.  Problem List Items Addressed This Visit   Unspecified hypothyroidism     Adequately supplemented with Levothyroxine , TSH 3.573 10/02/13            Projectile vomiting     Denied nausea, stomach pain, indigestion, or diarrhea. Last large BM 10/29/13-no fecal impaction per digital check. Urine dark but not cloudy-urine culture sample obtained. She is afebrile. No focal neurological deficits noted. ER evaluation.     Postherpetic neuralgia     Started Gabapentin 100mg  bid since 10/14/13 for pain--helped but not adequate--better controlled with Gabapentin 100mg  tid and able to decrease Hydrocodone to q6hr        Peripheral neuropathy     Chronic and longstanding needle pricking pain in feet. Takes Tylenol routinely at home. Hx of Guillain Barre.          Memory impairment     MMSE 20/30 10/2013    Hyponatremia     Last serum Na 13112/1/14 then 130 10/31/13        Fall at nursing home     10/28/13 fell in her bathroom during night when she took herself to bathroom w/c calling for assistance. MMSE 20/30 10/2013. Lack of safety awareness and her gait disorder contributed to her falling. Intensive supervision needed.     Edema     BLE--takes Lozol 2.5mg  and Kcl bid. added Spironolactone 25mg  daily. K 4.6, Bun/creat 12/0.77, BNP 264.1 10/31/13      Contusion  of scalp - Primary     Larey Seat 10/28/13 in her bathroom while taking herself to the commode w/o calling for assistance. Sustained a large left forehead bruise. Nero check was intact. Projectile vomiting x1 10/30/13 and x1 10/31/13. Last large BM was 10/29/13. Urine in dark color-but not cloudy or odorous. She is afebrile.     Anticoagulation goal of INR 2 to 3     INR 3.56 10/27/13-Coumadin held 10/27/13-decreased to 6mg  daily, repeat PT/INR in one week scheduled from 10/27/13    Anemia, chronic disease     Hgb dropped from 14-11 in one month. Hemoccult stools x3 and may consider anemia panel.     A-fib     Rate controlled on Digoxin 0.125mg  and Atenolol 50mg  daily, Dig level 0.3 10/02/13               Review of Systems:  Review of Systems  Constitutional: Negative for fever, chills, weight loss, malaise/fatigue and diaphoresis.       Generalized and lower body weakness.   HENT: Positive for hearing loss. Negative for congestion, ear discharge, ear pain, nosebleeds, sore throat and tinnitus.   Eyes: Negative for blurred vision, double vision, photophobia, pain, discharge and redness.  Respiratory: Negative for cough, hemoptysis, sputum production, shortness of breath, wheezing and stridor.   Cardiovascular: Positive  for leg swelling. Negative for chest pain, palpitations, orthopnea, claudication and PND.       BLE  Gastrointestinal: Positive for vomiting. Negative for heartburn, nausea, abdominal pain, diarrhea, constipation, blood in stool and melena.       Projectile x1 10/30/13 and x1 10/31/13  Genitourinary: Positive for frequency. Negative for dysuria, urgency, hematuria and flank pain.  Musculoskeletal: Positive for back pain. Negative for falls, joint pain, myalgias and neck pain.       From her left shingle site travels to her left back.   Skin: Positive for rash.       Left flank and abd shingle lesions healing nicely. Bruise at the left forehead into her hairline.   Neurological:  Positive for sensory change and weakness. Negative for dizziness, tingling, tremors, speech change, focal weakness, seizures, loss of consciousness and headaches.       BLE needle pricking. Left flank postherpetic neuralgia.   Endo/Heme/Allergies: Negative for environmental allergies and polydipsia. Bruises/bleeds easily.  Psychiatric/Behavioral: Positive for memory loss. Negative for depression, suicidal ideas, hallucinations and substance abuse. The patient is not nervous/anxious and does not have insomnia.        Mild confusion occasionally.      Past Medical History  Diagnosis Date  . Afib   . Shingles 10/01/13  . Pulmonary embolism 1990  . Guillain-Barre 1990    resolved  . Unspecified hypothyroidism   . Neuropathy    Past Surgical History  Procedure Laterality Date  . Breast lumpectomy Right 1965    Dr. Judithe Modest   Social History:   reports that she has never smoked. She does not have any smokeless tobacco history on file. She reports that she does not drink alcohol or use illicit drugs.  Medications: Patient's Medications  New Prescriptions   No medications on file  Previous Medications   ACETAMINOPHEN (TYLENOL) 650 MG CR TABLET    Take 650 mg by mouth every 8 (eight) hours as needed for pain.    ATENOLOL (TENORMIN) 50 MG TABLET    Take 50 mg by mouth daily.   CHOLECALCIFEROL (VITAMIN D) 1000 UNITS TABLET    Take 1,000 Units by mouth daily.   DARIFENACIN (ENABLEX) 7.5 MG 24 HR TABLET    Take 7.5 mg by mouth daily.   DIGOXIN (LANOXIN) 0.125 MG TABLET    Take 0.125 mg by mouth daily.   FISH OIL-OMEGA-3 FATTY ACIDS 1000 MG CAPSULE    Take 2 g by mouth daily.   GABAPENTIN (NEURONTIN) 100 MG CAPSULE    Take 100 mg by mouth 3 (three) times daily.   GLUCOSAMINE-CHONDROITIN 500-400 MG TABLET    Take 1 tablet by mouth 3 (three) times daily.   INDAPAMIDE (LOZOL) 2.5 MG TABLET    Take 2.5 mg by mouth every morning.   LEVOTHYROXINE (SYNTHROID, LEVOTHROID) 125 MCG TABLET    Take 125  mcg by mouth daily.   MULTIPLE VITAMINS-MINERALS (MULTIVITAMIN WITH IRON-MINERALS) LIQUID    Take by mouth daily.   POTASSIUM CHLORIDE (K-DUR) 10 MEQ TABLET    Take 10 mEq by mouth 2 (two) times daily.   SPIRONOLACTONE (ALDACTONE) 25 MG TABLET    Take 25 mg by mouth daily.   VALACYCLOVIR (VALTREX) 1000 MG TABLET       WARFARIN (COUMADIN) 7.5 MG TABLET    Take 7.5 mg by mouth daily.  Modified Medications   No medications on file  Discontinued Medications   No medications on file     Physical  Exam: Physical Exam  Constitutional: She is oriented to person, place, and time. She appears well-developed and well-nourished. No distress.  HENT:  Head: Normocephalic and atraumatic.  Right Ear: External ear normal.  Left Ear: External ear normal.  Nose: Nose normal.  Mouth/Throat: Oropharynx is clear and moist. No oropharyngeal exudate.  Eyes: Conjunctivae and EOM are normal. Pupils are equal, round, and reactive to light. Right eye exhibits no discharge. Left eye exhibits no discharge. No scleral icterus.  Neck: Normal range of motion. Neck supple. No JVD present. No tracheal deviation present. No thyromegaly present.  Cardiovascular: Normal rate, regular rhythm, normal heart sounds and intact distal pulses.   No murmur heard. Pulmonary/Chest: Effort normal and breath sounds normal. No stridor. No respiratory distress. She has no wheezes. She has no rales.  Abdominal: Soft. Bowel sounds are normal. She exhibits no distension. There is no tenderness. There is no rebound and no guarding.  Musculoskeletal: Normal range of motion. She exhibits edema. She exhibits no tenderness.  Unable to stand on her own. BLE edema 2+  Lymphadenopathy:    She has no cervical adenopathy.  Neurological: She is alert and oriented to person, place, and time. She has normal reflexes. No cranial nerve deficit. She exhibits normal muscle tone. Coordination normal.  Skin: Skin is warm and dry. Rash noted. She is not  diaphoretic. There is erythema.  Shingles left flank and abd-healing nicely.   Psychiatric: She has a normal mood and affect. Her behavior is normal. Judgment and thought content normal.    Filed Vitals:   10/31/13 1525  BP: 156/82  Pulse: 88  Temp: 98.5 F (36.9 C)  TempSrc: Tympanic  Resp: 20      Labs reviewed: Basic Metabolic Panel:  Recent Labs  16/10/96 1301 10/02/13 10/27/13 10/31/13  NA 131* 133* 131* 130*  K 3.7 3.7 3.6 4.6  CL 92*  --   --   --   CO2 26  --   --   --   GLUCOSE 109*  --   --   --   BUN 12 11 16 12   CREATININE 0.84 0.7 0.8 0.8  CALCIUM 9.4  --   --   --   TSH  --  3.57  --   --    Liver Function Tests:  Recent Labs  10/01/13 1301  AST 31  ALT 21  ALKPHOS 47  BILITOT 0.4  PROT 7.6  ALBUMIN 3.7   CBC:  Recent Labs  10/01/13 1301 10/02/13 10/31/13  WBC 5.2 5.2 5.6  NEUTROABS 3.0  --   --   HGB 13.5 14.0 11.9*  HCT 39.3 40 34*  MCV 87.5  --   --   PLT 187 210 216    Past Procedures:  10/28/13 CXR no radiographic evidence of acute pulmonary disease.    Assessment/Plan Contusion of scalp Larey Seat 10/28/13 in her bathroom while taking herself to the commode w/o calling for assistance. Sustained a large left forehead bruise. Nero check was intact. Projectile vomiting x1 10/30/13 and x1 10/31/13. Last large BM was 10/29/13. Urine in dark color-but not cloudy or odorous. She is afebrile.   A-fib Rate controlled on Digoxin 0.125mg  and Atenolol 50mg  daily, Dig level 0.3 10/02/13          Fall at nursing home 10/28/13 fell in her bathroom during night when she took herself to bathroom w/c calling for assistance. MMSE 20/30 10/2013. Lack of safety awareness and her gait disorder contributed to her  falling. Intensive supervision needed.   Edema BLE--takes Lozol 2.5mg  and Kcl bid. added Spironolactone 25mg  daily. K 4.6, Bun/creat 12/0.77, BNP 264.1 10/31/13    Anticoagulation goal of INR 2 to 3 INR 3.56 10/27/13-Coumadin held  10/27/13-decreased to 6mg  daily, repeat PT/INR in one week scheduled from 10/27/13  Hyponatremia Last serum Na 13112/1/14 then 130 10/31/13      Memory impairment MMSE 20/30 10/2013  Peripheral neuropathy Chronic and longstanding needle pricking pain in feet. Takes Tylenol routinely at home. Hx of Guillain Barre.        Postherpetic neuralgia Started Gabapentin 100mg  bid since 10/14/13 for pain--helped but not adequate--better controlled with Gabapentin 100mg  tid and able to decrease Hydrocodone to q6hr      Unspecified hypothyroidism Adequately supplemented with Levothyroxine , TSH 3.573 10/02/13          Anemia, chronic disease Hgb dropped from 14-11 in one month. Hemoccult stools x3 and may consider anemia panel.   Projectile vomiting Denied nausea, stomach pain, indigestion, or diarrhea. Last large BM 10/29/13-no fecal impaction per digital check. Urine dark but not cloudy-urine culture sample obtained. She is afebrile. No focal neurological deficits noted. ER evaluation.     Family/ Staff Communication: observe the patient.   Goals of Care: SNF  Labs/tests ordered: CBC, BMP, BNP done today. UA C/S stat.

## 2013-10-31 NOTE — Assessment & Plan Note (Signed)
Chronic and longstanding needle pricking pain in feet. Takes Tylenol routinely at home. Hx of Guillain Barre.   

## 2013-10-31 NOTE — Assessment & Plan Note (Signed)
10/28/13 fell in her bathroom during night when she took herself to bathroom w/c calling for assistance. MMSE 20/30 10/2013. Lack of safety awareness and her gait disorder contributed to her falling. Intensive supervision needed.

## 2013-10-31 NOTE — H&P (Signed)
[facility] History and Physical  Patient: Shirley Mccormick  EAV:[id number]  DOB: [birth year]  DOS: the patient was seen and examined on 10/31/2013 PCP: Gwen Pounds, MD  Chief Complaint: Fall  HPI: Shirley Mccormick is a 79 y.o. female with Past medical history of atrial fibrillation, hypothyroidism, neuropathy. The patient is coming from SNF. The patient presents with complaints of fall that happened 2 days ago on second December. The fall was mechanical and she did not sustain any neurological deficit after the fall. She is on Coumadin for A. fib with therapeutic INR. After the fall today she started having projectile vomiting and therefore she was sent to the hospital. She sustained an injury on her for after the fall but as per the nursing documentation she did not have any other injury nor she had any significant change in her mentation. The follow thought to be a mechanical fall. At the time of my evaluation the patient appears poor historian due to her baseline dementia. But she does not complain of any headache, blurred vision, focal neurological deficit, chest pain, shortness of breath, nausea, vomiting, abdominal pain, diarrhea or burning urination. Her CODE STATUS as per recent documentation in the nursing home is DO NOT RESUSCITATE.  Review of Systems: as mentioned in the history of present illness.  A Comprehensive review of the other systems is negative.  Past Medical History  Diagnosis Date  . Afib   . Shingles 10/01/13  . Pulmonary embolism 1990  . Guillain-Barre 1990    resolved  . Unspecified hypothyroidism   . Neuropathy    Past Surgical History  Procedure Laterality Date  . Breast lumpectomy Right 1965    Dr. Judithe Modest   Social History:  reports that she has never smoked. She does not have any smokeless tobacco history on file. She reports that she does not drink alcohol or use illicit drugs. Partiality Independent for most of her  ADL.  Allergies  Allergen  Reactions  . Erythromycin Rash  . Penicillins Rash  . Tetanus Toxoids Rash    No family history on file.  Prior to Admission medications   Medication Sig Start Date End Date Taking? Authorizing Provider  acetaminophen (TYLENOL) 325 MG tablet Take 650 mg by mouth every 6 (six) hours as needed for mild pain.   Yes Historical Provider, MD  atenolol (TENORMIN) 50 MG tablet Take 50 mg by mouth daily.   Yes Historical Provider, MD  cholecalciferol (VITAMIN D) 1000 UNITS tablet Take 1,000 Units by mouth every morning.    Yes Historical Provider, MD  digoxin (LANOXIN) 0.125 MG tablet Take 0.125 mg by mouth every morning.    Yes Historical Provider, MD  diphenhydramine-acetaminophen (TYLENOL PM) 25-500 MG TABS Take 2 tablets by mouth at bedtime.   Yes Historical Provider, MD  fish oil-omega-3 fatty acids 1000 MG capsule Take 1 g by mouth 2 (two) times daily.    Yes Historical Provider, MD  gabapentin (NEURONTIN) 100 MG capsule Take 100 mg by mouth 3 (three) times daily.   Yes Historical Provider, MD  glucosamine-chondroitin 500-400 MG tablet Take 1 tablet by mouth 3 (three) times daily.   Yes Historical Provider, MD  HYDROcodone-acetaminophen (NORCO/VICODIN) 5-325 MG per tablet Take 1 tablet by mouth every 6 (six) hours.   Yes Historical Provider, MD  hydrOXYzine (ATARAX/VISTARIL) 25 MG tablet Take 25 mg by mouth every 8 (eight) hours as needed for itching.   Yes Historical Provider, MD  indapamide (LOZOL) 2.5 MG tablet Take  2.5 mg by mouth every morning.   Yes Historical Provider, MD  ipratropium-albuterol (DUONEB) 0.5-2.5 (3) MG/3ML SOLN Take 3 mLs by nebulization 3 (three) times daily. For 3 days only. START 12.3.14 END 12.5.14   Yes Historical Provider, MD  levothyroxine (SYNTHROID, LEVOTHROID) 125 MCG tablet Take 125 mcg by mouth daily.   Yes Historical Provider, MD  Multiple Vitamins-Minerals (CERTAVITE/ANTIOXIDANTS PO) Take 1 tablet by mouth every morning.   Yes Historical Provider, MD   potassium chloride (K-DUR) 10 MEQ tablet Take 10 mEq by mouth 2 (two) times daily.   Yes Historical Provider, MD  spironolactone (ALDACTONE) 25 MG tablet Take 25 mg by mouth every morning.    Yes Historical Provider, MD  warfarin (COUMADIN) 1 MG tablet Take 7 mg by mouth daily at 6 PM.   Yes Historical Provider, MD  warfarin (COUMADIN) 6 MG tablet Take 6 mg by mouth daily at 6 PM.   Yes Historical Provider, MD    Physical Exam: Filed Vitals:   10/31/13 1827 10/31/13 1900 10/31/13 2030 10/31/13 2100  BP: 171/94  173/78 150/74  Pulse: 83 87 102 87  Temp: 97.8 F (36.6 C)     TempSrc: Oral     Resp: 20 19 24 17   SpO2: 88% 100% 97% 99%    General: Alert, Awake and disoriented to Time, Place and Person. Appear in moderate distress Eyes: PERRL ENT: Oral Mucosa clear dry. Neck: No JVD Cardiovascular: S1 and S2 Present, aortic systolic Murmur, Peripheral Pulses Present Respiratory: Bilateral Air entry equal and Decreased, bilateral right more than left Crackles, no wheezes Abdomen: Bowel Sound Present, Soft and Non tender Skin: No Rash, bilateral leg shows redness with warmth Extremities: Bilateral Pedal edema, no calf tenderness Neurologic: Other than confusion Grossly Unremarkable.  Labs on Admission:  CBC:  Recent Labs Lab 10/31/13 10/31/13 1845 10/31/13 1900  WBC 5.6 7.5  --   NEUTROABS  --  5.2  --   HGB 11.9* 12.4 13.6  HCT 34* 35.0* 40.0  MCV  --  88.2  --   PLT 216 222  --     CMP     Component Value Date/Time   NA 124* 10/31/2013 1900   NA 130* 10/31/2013   K 3.3* 10/31/2013 1900   CL 85* 10/31/2013 1900   CO2 26 10/01/2013 1301   GLUCOSE 128* 10/31/2013 1900   BUN 10 10/31/2013 1900   BUN 12 10/31/2013   CREATININE 0.80 10/31/2013 1900   CREATININE 0.8 10/31/2013   CALCIUM 9.4 10/01/2013 1301   PROT 7.6 10/01/2013 1301   ALBUMIN 3.7 10/01/2013 1301   AST 31 10/01/2013 1301   ALT 21 10/01/2013 1301   ALKPHOS 47 10/01/2013 1301   BILITOT 0.4 10/01/2013 1301   GFRNONAA  61* 10/01/2013 1301   GFRAA 71* 10/01/2013 1301    No results found for this basename: LIPASE, AMYLASE,  in the last 168 hours No results found for this basename: AMMONIA,  in the last 168 hours  No results found for this basename: CKTOTAL, CKMB, CKMBINDEX, TROPONINI,  in the last 168 hours BNP (last 3 results)  Recent Labs  10/31/13 1853  PROBNP 4119.0*    Radiological Exams on Admission: Ct Head Wo Contrast  10/31/2013   CLINICAL DATA:  Projectile vomiting this morning.  Fell 3 days ago.  EXAM: CT HEAD WITHOUT CONTRAST  CT CERVICAL SPINE WITHOUT CONTRAST  TECHNIQUE: Multidetector CT imaging of the head and cervical spine was performed following the standard protocol without  intravenous contrast. Multiplanar CT image reconstructions of the cervical spine were also generated.  COMPARISON:  None.  FINDINGS: CT HEAD FINDINGS  Diffusely enlarged ventricles and subarachnoid spaces. Sphenoid sinus air-fluid level. No skull fracture or intracranial hemorrhage seen.  CT CERVICAL SPINE FINDINGS  Multilevel degenerative changes. These include facet degenerative changes at multiple levels, most pronounced on the left at the C3-4 level, with associated 3 mm of anterolisthesis at that level. No prevertebral soft tissue swelling or fractures seen. Biapical parenchymal scarring with calcification. Previously noted sphenoid sinus air-fluid level. No skull base fracture visualized.  IMPRESSION: 1. Acute left sphenoid sinusitis vs. blood in the sinus. 2. No skull fracture or intracranial hemorrhage seen. 3. No cervical spine fracture or traumatic subluxation. 4. Multilevel cervical spine degenerative changes. 5. Atrophy and chronic small vessel white matter ischemic changes in both cerebral hemispheres.   Electronically Signed   By: Gordan Payment M.D.   On: 10/31/2013 20:24   Ct Cervical Spine Wo Contrast  10/31/2013   CLINICAL DATA:  Projectile vomiting this morning.  Fell 3 days ago.  EXAM: CT HEAD WITHOUT  CONTRAST  CT CERVICAL SPINE WITHOUT CONTRAST  TECHNIQUE: Multidetector CT imaging of the head and cervical spine was performed following the standard protocol without intravenous contrast. Multiplanar CT image reconstructions of the cervical spine were also generated.  COMPARISON:  None.  FINDINGS: CT HEAD FINDINGS  Diffusely enlarged ventricles and subarachnoid spaces. Sphenoid sinus air-fluid level. No skull fracture or intracranial hemorrhage seen.  CT CERVICAL SPINE FINDINGS  Multilevel degenerative changes. These include facet degenerative changes at multiple levels, most pronounced on the left at the C3-4 level, with associated 3 mm of anterolisthesis at that level. No prevertebral soft tissue swelling or fractures seen. Biapical parenchymal scarring with calcification. Previously noted sphenoid sinus air-fluid level. No skull base fracture visualized.  IMPRESSION: 1. Acute left sphenoid sinusitis vs. blood in the sinus. 2. No skull fracture or intracranial hemorrhage seen. 3. No cervical spine fracture or traumatic subluxation. 4. Multilevel cervical spine degenerative changes. 5. Atrophy and chronic small vessel white matter ischemic changes in both cerebral hemispheres.   Electronically Signed   By: Gordan Payment M.D.   On: 10/31/2013 20:24   Ct Abdomen Pelvis W Contrast  10/31/2013   CLINICAL DATA:  Abdominal distention, vomiting and fall. Altered mental status.  EXAM: CT ABDOMEN AND PELVIS WITH CONTRAST  TECHNIQUE: Multidetector CT imaging of the abdomen and pelvis was performed using the standard protocol following bolus administration of intravenous contrast.  CONTRAST:  OMNIPAQUE IOHEXOL 300 MG/ML  SOLN  COMPARISON:  Abdominal radiograph performed earlier today at 7:35 p.m.  FINDINGS: A trace left pleural effusion is noted, with mild bilateral peripheral scarring.  There is a slightly nodular contour to the liver, without definite evidence of cirrhotic change. The spleen is unremarkable in  appearance; vascular calcification is noted at the splenic hilum. The gallbladder is decompressed and not well assessed. The pancreas and adrenal glands are within normal limits.  The kidneys are unremarkable in appearance. There is no evidence of hydronephrosis. No renal or ureteral stones are seen. Nonspecific perinephric stranding is noted bilaterally.  No free fluid is identified. The small bowel is unremarkable in appearance. The stomach is within normal limits. No acute vascular abnormalities are seen. Diffuse calcification is noted along the abdominal aorta and its branches, with mild thrombus noted along the distal descending thoracic aorta.  The appendix is normal in caliber, without evidence for appendicitis. There is  intermittent distention of the colon, still within normal limits in caliber, with fluid seen filling the mid to distal sigmoid colon and rectum. Apparent mild wall thickening is noted along the mid sigmoid colon, possibly reflecting a mild infectious or inflammatory process. The colon is mildly redundant.  The bladder is mildly distended and grossly unremarkable in appearance. The uterus remains normal in size, with a single small calcified fibroid. The ovaries are relatively symmetric ; no suspicious adnexal masses are seen. Diffuse presacral stranding is nonspecific in appearance. No inguinal lymphadenopathy is seen.  No acute osseous abnormalities are identified. Left convex lumbar scoliosis is noted, with associated vacuum phenomenon.  IMPRESSION: 1. Intermittent distention of the colon, still within normal limits in caliber, with fluid filling the mid to distal sigmoid colon and rectum. Apparent mild wall thickening along the mid sigmoid colon may reflect a mild infectious or inflammatory process. 2. Trace left pleural effusion, with mild bilateral peripheral scarring. 3. Diffuse calcification along the abdominal aorta and its branches, with mild thrombus along the distal descending  thoracic aorta. 4. Nonspecific diffuse presacral stranding noted. 5. Left convex lumbar scoliosis noted.   Electronically Signed   By: Roanna Raider M.D.   On: 10/31/2013 22:52   Dg Abd Acute W/chest  10/31/2013   CLINICAL DATA:  Distended abdomen.  EXAM: ACUTE ABDOMEN SERIES (ABDOMEN 2 VIEW & CHEST 1 VIEW)  COMPARISON:  CT stress set chest radiograph from 4/18/7  FINDINGS: Moderate distension of the large bowel loops identified. No radiopaque calculi or other significant radiographic abnormality is seen. Heart size is mildly enlarged. There is blunting of the costophrenic angles as well as pulmonary vascular congestion. Both lungs are clear.  IMPRESSION: 1. Moderate distension of the large bowel loops identified. Findings may reflect colonic ileus or distal bowel obstruction. Consider further assessment with CT of the abdomen and pelvis with oral contrast material. 2. Suspect mild CHF.   Electronically Signed   By: Signa Kell M.D.   On: 10/31/2013 19:49    EKG: Independently reviewed. unchanged from previous tracings, normal sinus rhythm, nonspecific ST and T waves changes.  Assessment/Plan Principal Problem:   Cellulitis Active Problems:   Hyponatremia   A-fib   Postherpetic neuralgia   Fall at nursing home   Projectile vomiting   1. Cellulitis The patient presents with a fall. Although she appears to have redness and warmth of her leg with swelling which is consistent with cellulitis. I will give her  of IV vancomycin for the treatment of the same  2. Possible colitis on the CT scan Patient presents with complaints of vomiting after a fall. Her CT scan of the head and C-spine does not show any acute abnormality. But her CT scan of the abdomen does show pancolitis. At present I would start her on IV Cipro and Flagyl as well. I will keep her nothing by mouth. But I would hydrate her gently for short duration. I would also proceed C diff by PCR In  3. Postherpetic neuralgia Patient  recently had herpes infection and then had postherpetic neuralgia for which she was placed on pain medications Due to her confusion and acute encephalopathy secondary to metabolic etiology I would hold off on giving her any medications for pain   DVT Prophylaxis: subcutaneous Heparin Nutrition: N.p.o.  Code Status: DO NOT RESUSCITATE as per the documentation  Disposition: Admitted to inpatient in telemetry.  Author: Lynden Oxford, MD [facility] Pager: [phone] 10/31/2013, 11:54 PM    If 7PM-7AM,  please contact night-coverage [email or web address] Password [email or web address]

## 2013-10-31 NOTE — Assessment & Plan Note (Signed)
Adequately supplemented with Levothyroxine 125mcg, TSH 3.573 10/02/13               

## 2013-10-31 NOTE — Assessment & Plan Note (Signed)
Hgb dropped from 14-11 in one month. Hemoccult stools x3 and may consider anemia panel.

## 2013-10-31 NOTE — ED Provider Notes (Signed)
CSN: [id number]     Arrival date & time 10/31/13  1810 History   First MD Initiated Contact with Patient 10/31/13 1836     Chief Complaint  Patient presents with  . Fall  . Altered Mental Status   (Consider location/radiation/quality/duration/timing/severity/associated sxs/prior Treatment) HPI Comments: Level 5 caveat applies secondary to altered MS  presents from a friend's home last where she fell 3 days ago according to the paramedics. The paramedics were initially called him at that time but the patient was not transported to the emergency department per their report. The patient does have bruising on her 4 head and had projectile vomiting earlier in the day.  Chief complaint vomiting  The patient thinks that she lives with her husband at home, she is unable to give any further information.  Patient is a 79 y.o. female presenting with fall and altered mental status. The history is provided by the patient and the EMS personnel.  Fall  Altered Mental Status   Past Medical History  Diagnosis Date  . Afib   . Shingles 10/01/13  . Pulmonary embolism 1990  . Guillain-Barre 1990    resolved  . Unspecified hypothyroidism   . Neuropathy    Past Surgical History  Procedure Laterality Date  . Breast lumpectomy Right 1965    Dr. Judithe Modest   No family history on file. History  Substance Use Topics  . Smoking status: Never Smoker   . Smokeless tobacco: Not on file  . Alcohol Use: No   OB History   Grav Para Term Preterm Abortions TAB SAB Ect Mult Living                 Review of Systems  Unable to perform ROS: Mental status change    Allergies  Erythromycin; Penicillins; and Tetanus toxoids  Home Medications   Current Outpatient Rx  Name  Route  Sig  Dispense  Refill  . acetaminophen (TYLENOL) 325 MG tablet   Oral   Take 650 mg by mouth every 6 (six) hours as needed for mild pain.         Marland Kitchen atenolol (TENORMIN) 50 MG tablet   Oral   Take 50 mg by mouth  daily.         . cholecalciferol (VITAMIN D) 1000 UNITS tablet   Oral   Take 1,000 Units by mouth every morning.          . digoxin (LANOXIN) 0.125 MG tablet   Oral   Take 0.125 mg by mouth every morning.          . diphenhydramine-acetaminophen (TYLENOL PM) 25-500 MG TABS   Oral   Take 2 tablets by mouth at bedtime.         . fish oil-omega-3 fatty acids 1000 MG capsule   Oral   Take 1 g by mouth 2 (two) times daily.          Marland Kitchen gabapentin (NEURONTIN) 100 MG capsule   Oral   Take 100 mg by mouth 3 (three) times daily.         Marland Kitchen glucosamine-chondroitin 500-400 MG tablet   Oral   Take 1 tablet by mouth 3 (three) times daily.         Marland Kitchen HYDROcodone-acetaminophen (NORCO/VICODIN) 5-325 MG per tablet   Oral   Take 1 tablet by mouth every 6 (six) hours.         . hydrOXYzine (ATARAX/VISTARIL) 25 MG tablet   Oral   Take 25  mg by mouth every 8 (eight) hours as needed for itching.         . indapamide (LOZOL) 2.5 MG tablet   Oral   Take 2.5 mg by mouth every morning.         Marland Kitchen ipratropium-albuterol (DUONEB) 0.5-2.5 (3) MG/3ML SOLN   Nebulization   Take 3 mLs by nebulization 3 (three) times daily. For 3 days only. START 12.3.14 END 12.5.14         . levothyroxine (SYNTHROID, LEVOTHROID) 125 MCG tablet   Oral   Take 125 mcg by mouth daily.         . Multiple Vitamins-Minerals (CERTAVITE/ANTIOXIDANTS PO)   Oral   Take 1 tablet by mouth every morning.         . potassium chloride (K-DUR) 10 MEQ tablet   Oral   Take 10 mEq by mouth 2 (two) times daily.         Marland Kitchen spironolactone (ALDACTONE) 25 MG tablet   Oral   Take 25 mg by mouth every morning.          . warfarin (COUMADIN) 1 MG tablet   Oral   Take 7 mg by mouth daily at 6 PM.         . warfarin (COUMADIN) 6 MG tablet   Oral   Take 6 mg by mouth daily at 6 PM.          BP 150/74  Pulse 87  Temp(Src) 97.8 F (36.6 C) (Oral)  Resp 17  SpO2 99% Physical Exam  Nursing note and  vitals reviewed. Constitutional: She appears well-developed and well-nourished. No distress.  HENT:  Head: Normocephalic.  Mouth/Throat: Oropharynx is clear and moist. No oropharyngeal exudate.  Bruising to the left forehead, this has elements of the bruising such as yellow and brown discoloration of the skin, slight purple discoloration of the skin, no periorbital hematoma or ecchymosis, no hemotympanum, no malocclusion  Eyes: Conjunctivae and EOM are normal. Pupils are equal, round, and reactive to light. Right eye exhibits no discharge. Left eye exhibits no discharge. No scleral icterus.  Neck: No JVD present. No thyromegaly present.  Cardiovascular: Regular rhythm, normal heart sounds and intact distal pulses.  Exam reveals no gallop and no friction rub.   No murmur heard. Atrial fibrillation, palpable radial artery pulses  Pulmonary/Chest: Effort normal. No respiratory distress. She has wheezes. She has rales.  Bilateral mild expiratory wheezing and inspiratory rales at the bases, speaks in full sentences, no distress  Abdominal: Soft. Bowel sounds are normal. She exhibits distension. She exhibits no mass. There is no tenderness.  Diffusely distended abdomen which is tympanitic to percussion, no guarding or tenderness  Musculoskeletal: Normal range of motion. She exhibits edema ( Bilateral symmetrical severely edematous lower extremities). She exhibits no tenderness.  Lymphadenopathy:    She has no cervical adenopathy.  Neurological: She is alert. Coordination normal.  Confused to location, date, circumstances but knows her name, moves all extremities x4, diffusely weak, no focal weakness  Skin: Skin is warm and dry.  Bruising to forehead as above  Psychiatric: She has a normal mood and affect. Her behavior is normal.    ED Course  Procedures (including critical care time) Labs Review Labs Reviewed  CBC WITH DIFFERENTIAL - Abnormal; Notable for the following:    HCT 35.0 (*)     Monocytes Relative 15 (*)    Monocytes Absolute 1.1 (*)    All other components within normal limits  URINALYSIS, ROUTINE  W REFLEX MICROSCOPIC - Abnormal; Notable for the following:    Leukocytes, UA SMALL (*)    All other components within normal limits  PRO B NATRIURETIC PEPTIDE - Abnormal; Notable for the following:    Pro B Natriuretic peptide (BNP) 4119.0 (*)    All other components within normal limits  PROTIME-INR - Abnormal; Notable for the following:    Prothrombin Time 19.5 (*)    INR 1.70 (*)    All other components within normal limits  APTT - Abnormal; Notable for the following:    aPTT 40 (*)    All other components within normal limits  URINE MICROSCOPIC-ADD ON - Abnormal; Notable for the following:    Squamous Epithelial / LPF FEW (*)    All other components within normal limits  POCT I-STAT, CHEM 8 - Abnormal; Notable for the following:    Sodium 124 (*)    Potassium 3.3 (*)    Chloride 85 (*)    Glucose, Bld 128 (*)    Calcium, Ion 1.08 (*)    All other components within normal limits  CG4 I-STAT (LACTIC ACID)  POCT I-STAT TROPONIN I   Imaging Review Ct Head Wo Contrast  10/31/2013   CLINICAL DATA:  Projectile vomiting this morning.  Fell 3 days ago.  EXAM: CT HEAD WITHOUT CONTRAST  CT CERVICAL SPINE WITHOUT CONTRAST  TECHNIQUE: Multidetector CT imaging of the head and cervical spine was performed following the standard protocol without intravenous contrast. Multiplanar CT image reconstructions of the cervical spine were also generated.  COMPARISON:  None.  FINDINGS: CT HEAD FINDINGS  Diffusely enlarged ventricles and subarachnoid spaces. Sphenoid sinus air-fluid level. No skull fracture or intracranial hemorrhage seen.  CT CERVICAL SPINE FINDINGS  Multilevel degenerative changes. These include facet degenerative changes at multiple levels, most pronounced on the left at the C3-4 level, with associated 3 mm of anterolisthesis at that level. No prevertebral soft tissue  swelling or fractures seen. Biapical parenchymal scarring with calcification. Previously noted sphenoid sinus air-fluid level. No skull base fracture visualized.  IMPRESSION: 1. Acute left sphenoid sinusitis vs. blood in the sinus. 2. No skull fracture or intracranial hemorrhage seen. 3. No cervical spine fracture or traumatic subluxation. 4. Multilevel cervical spine degenerative changes. 5. Atrophy and chronic small vessel white matter ischemic changes in both cerebral hemispheres.   Electronically Signed   By: Gordan Payment M.D.   On: 10/31/2013 20:24   Ct Cervical Spine Wo Contrast  10/31/2013   CLINICAL DATA:  Projectile vomiting this morning.  Fell 3 days ago.  EXAM: CT HEAD WITHOUT CONTRAST  CT CERVICAL SPINE WITHOUT CONTRAST  TECHNIQUE: Multidetector CT imaging of the head and cervical spine was performed following the standard protocol without intravenous contrast. Multiplanar CT image reconstructions of the cervical spine were also generated.  COMPARISON:  None.  FINDINGS: CT HEAD FINDINGS  Diffusely enlarged ventricles and subarachnoid spaces. Sphenoid sinus air-fluid level. No skull fracture or intracranial hemorrhage seen.  CT CERVICAL SPINE FINDINGS  Multilevel degenerative changes. These include facet degenerative changes at multiple levels, most pronounced on the left at the C3-4 level, with associated 3 mm of anterolisthesis at that level. No prevertebral soft tissue swelling or fractures seen. Biapical parenchymal scarring with calcification. Previously noted sphenoid sinus air-fluid level. No skull base fracture visualized.  IMPRESSION: 1. Acute left sphenoid sinusitis vs. blood in the sinus. 2. No skull fracture or intracranial hemorrhage seen. 3. No cervical spine fracture or traumatic subluxation. 4. Multilevel cervical spine  degenerative changes. 5. Atrophy and chronic small vessel white matter ischemic changes in both cerebral hemispheres.   Electronically Signed   By: Gordan Payment M.D.   On:  10/31/2013 20:24   Ct Abdomen Pelvis W Contrast  10/31/2013   CLINICAL DATA:  Abdominal distention, vomiting and fall. Altered mental status.  EXAM: CT ABDOMEN AND PELVIS WITH CONTRAST  TECHNIQUE: Multidetector CT imaging of the abdomen and pelvis was performed using the standard protocol following bolus administration of intravenous contrast.  CONTRAST:  OMNIPAQUE IOHEXOL 300 MG/ML  SOLN  COMPARISON:  Abdominal radiograph performed earlier today at 7:35 p.m.  FINDINGS: A trace left pleural effusion is noted, with mild bilateral peripheral scarring.  There is a slightly nodular contour to the liver, without definite evidence of cirrhotic change. The spleen is unremarkable in appearance; vascular calcification is noted at the splenic hilum. The gallbladder is decompressed and not well assessed. The pancreas and adrenal glands are within normal limits.  The kidneys are unremarkable in appearance. There is no evidence of hydronephrosis. No renal or ureteral stones are seen. Nonspecific perinephric stranding is noted bilaterally.  No free fluid is identified. The small bowel is unremarkable in appearance. The stomach is within normal limits. No acute vascular abnormalities are seen. Diffuse calcification is noted along the abdominal aorta and its branches, with mild thrombus noted along the distal descending thoracic aorta.  The appendix is normal in caliber, without evidence for appendicitis. There is intermittent distention of the colon, still within normal limits in caliber, with fluid seen filling the mid to distal sigmoid colon and rectum. Apparent mild wall thickening is noted along the mid sigmoid colon, possibly reflecting a mild infectious or inflammatory process. The colon is mildly redundant.  The bladder is mildly distended and grossly unremarkable in appearance. The uterus remains normal in size, with a single small calcified fibroid. The ovaries are relatively symmetric ; no suspicious adnexal  masses are seen. Diffuse presacral stranding is nonspecific in appearance. No inguinal lymphadenopathy is seen.  No acute osseous abnormalities are identified. Left convex lumbar scoliosis is noted, with associated vacuum phenomenon.  IMPRESSION: 1. Intermittent distention of the colon, still within normal limits in caliber, with fluid filling the mid to distal sigmoid colon and rectum. Apparent mild wall thickening along the mid sigmoid colon may reflect a mild infectious or inflammatory process. 2. Trace left pleural effusion, with mild bilateral peripheral scarring. 3. Diffuse calcification along the abdominal aorta and its branches, with mild thrombus along the distal descending thoracic aorta. 4. Nonspecific diffuse presacral stranding noted. 5. Left convex lumbar scoliosis noted.   Electronically Signed   By: Roanna Raider M.D.   On: 10/31/2013 22:52   Dg Abd Acute W/chest  10/31/2013   CLINICAL DATA:  Distended abdomen.  EXAM: ACUTE ABDOMEN SERIES (ABDOMEN 2 VIEW & CHEST 1 VIEW)  COMPARISON:  CT stress set chest radiograph from 4/18/7  FINDINGS: Moderate distension of the large bowel loops identified. No radiopaque calculi or other significant radiographic abnormality is seen. Heart size is mildly enlarged. There is blunting of the costophrenic angles as well as pulmonary vascular congestion. Both lungs are clear.  IMPRESSION: 1. Moderate distension of the large bowel loops identified. Findings may reflect colonic ileus or distal bowel obstruction. Consider further assessment with CT of the abdomen and pelvis with oral contrast material. 2. Suspect mild CHF.   Electronically Signed   By: Signa Kell M.D.   On: 10/31/2013 19:49    EKG  Interpretation   None       MDM   1. Hyponatremia   2. Nausea and vomiting in adult   3. Head injury, initial encounter    The patient has evidence of a fall several days ago, she does have hypertension and hypoxia, will need to evaluate for intracranial  injury, metabolic and infectious etiologies, hydration status, cardiac status. EKG reveals no signs of acute ischemia, atrial fibrillation is present, patient has a history of atrial fibrillation.  ED ECG REPORT  I personally interpreted this EKG   Date: 10/31/2013   Rate: 76  Rhythm: atrial fibrillation  QRS Axis: right  Intervals: normal  ST/T Wave abnormalities: nonspecific T wave changes  Conduction Disutrbances:none  Narrative Interpretation:   Old EKG Reviewed: none available  CT without acute findings to suggest cause for the N/V.  D/w hospitalist re: hyponatremia and n/v - they will admit for ongoing tx.      Vida Roller, MD 10/31/13 [phone]

## 2013-10-31 NOTE — Assessment & Plan Note (Signed)
Rate controlled on Digoxin 0.125mg and Atenolol 50mg daily, Dig level 0.3 10/02/13                 

## 2013-11-01 ENCOUNTER — Encounter (HOSPITAL_COMMUNITY): Payer: Self-pay | Admitting: *Deleted

## 2013-11-01 DIAGNOSIS — K5289 Other specified noninfective gastroenteritis and colitis: Secondary | ICD-10-CM | POA: Diagnosis present

## 2013-11-01 DIAGNOSIS — L0291 Cutaneous abscess, unspecified: Secondary | ICD-10-CM

## 2013-11-01 DIAGNOSIS — E871 Hypo-osmolality and hyponatremia: Secondary | ICD-10-CM

## 2013-11-01 DIAGNOSIS — L0390 Cellulitis, unspecified: Secondary | ICD-10-CM | POA: Diagnosis present

## 2013-11-01 DIAGNOSIS — W19XXXA Unspecified fall, initial encounter: Secondary | ICD-10-CM

## 2013-11-01 LAB — BASIC METABOLIC PANEL
BUN: 11 mg/dL (ref 6–23)
CO2: 28 mEq/L (ref 19–32)
Chloride: 84 mEq/L — ABNORMAL LOW (ref 96–112)
Creatinine, Ser: 0.57 mg/dL (ref 0.50–1.10)
GFR calc Af Amer: >90 mL/min (ref >90)
Glucose, Bld: 122 mg/dL — ABNORMAL HIGH (ref 70–99)
Potassium: 3.7 mEq/L (ref 3.5–5.1)

## 2013-11-01 LAB — COMPREHENSIVE METABOLIC PANEL
BUN: 10 mg/dL (ref 6–23)
CO2: 23 mEq/L (ref 19–32)
Calcium: 8.7 mg/dL (ref 8.4–10.5)
Creatinine, Ser: 0.54 mg/dL (ref 0.50–1.10)
GFR calc Af Amer: >90 mL/min (ref >90)
GFR calc non Af Amer: 83 mL/min — ABNORMAL LOW (ref >90)
Glucose, Bld: 115 mg/dL — ABNORMAL HIGH (ref 70–99)
Total Protein: 6.8 g/dL (ref 6.0–8.3)

## 2013-11-01 LAB — MRSA PCR SCREENING: MRSA by PCR: POSITIVE — AB

## 2013-11-01 LAB — CBC
HCT: 35.7 % — ABNORMAL LOW (ref 36.0–46.0)
Hemoglobin: 12.6 g/dL (ref 12.0–15.0)
MCH: 31.2 pg (ref 26.0–34.0)
MCHC: 35.3 g/dL (ref 30.0–36.0)
RBC: 4.04 MIL/uL (ref 3.87–5.11)
WBC: 8 10*3/uL (ref 4.0–10.5)

## 2013-11-01 LAB — DIGOXIN LEVEL: Digoxin Level: 0.9 ng/mL (ref 0.8–2.0)

## 2013-11-01 LAB — OSMOLALITY: Osmolality: 257 mOsm/kg — ABNORMAL LOW (ref 275–300)

## 2013-11-01 LAB — TSH: TSH: 3.021 u[IU]/mL (ref 0.350–4.500)

## 2013-11-01 MED ORDER — VANCOMYCIN HCL IN DEXTROSE 1-5 GM/200ML-% IV SOLN
1000.0000 mg | Freq: Two times a day (BID) | INTRAVENOUS | Status: DC
  Administered 2013-11-01 – 2013-11-02 (×3): 1000 mg via INTRAVENOUS
  Filled 2013-11-01 (×4): qty 200

## 2013-11-01 MED ORDER — WARFARIN - PHARMACIST DOSING INPATIENT: Freq: Every day | Status: DC

## 2013-11-01 MED ORDER — INDAPAMIDE 2.5 MG PO TABS
2.5000 mg | ORAL_TABLET | ORAL | Status: DC
  Administered 2013-11-01: 2.5 mg via ORAL
  Filled 2013-11-01 (×2): qty 1

## 2013-11-01 MED ORDER — ONDANSETRON HCL 4 MG/2ML IJ SOLN: 4.0000 mg | Freq: Four times a day (QID) | INTRAMUSCULAR | Status: DC | PRN

## 2013-11-01 MED ORDER — DIGOXIN 125 MCG PO TABS
0.1250 mg | ORAL_TABLET | Freq: Every morning | ORAL | Status: DC
  Administered 2013-11-01 – 2013-11-11 (×11): 0.125 mg via ORAL
  Filled 2013-11-01 (×12): qty 1

## 2013-11-01 MED ORDER — ALBUTEROL SULFATE (5 MG/ML) 0.5% IN NEBU
2.5000 mg | INHALATION_SOLUTION | RESPIRATORY_TRACT | Status: DC | PRN
  Administered 2013-11-01: 2.5 mg via RESPIRATORY_TRACT
  Filled 2013-11-01: qty 0.5

## 2013-11-01 MED ORDER — SODIUM CHLORIDE 0.9 % IV SOLN
INTRAVENOUS | Status: AC
  Administered 2013-11-01 (×2): via INTRAVENOUS

## 2013-11-01 MED ORDER — GABAPENTIN 100 MG PO CAPS
100.0000 mg | ORAL_CAPSULE | Freq: Three times a day (TID) | ORAL | Status: DC
  Administered 2013-11-01 – 2013-11-04 (×10): 100 mg via ORAL
  Filled 2013-11-01 (×12): qty 1

## 2013-11-01 MED ORDER — CIPROFLOXACIN IN D5W 400 MG/200ML IV SOLN
400.0000 mg | Freq: Two times a day (BID) | INTRAVENOUS | Status: DC
  Administered 2013-11-01 – 2013-11-04 (×9): 400 mg via INTRAVENOUS
  Filled 2013-11-01 (×11): qty 200

## 2013-11-01 MED ORDER — CHLORHEXIDINE GLUCONATE CLOTH 2 % EX PADS
6.0000 | MEDICATED_PAD | Freq: Every day | CUTANEOUS | Status: AC
  Administered 2013-11-02 – 2013-11-06 (×5): 6 via TOPICAL

## 2013-11-01 MED ORDER — ONDANSETRON HCL 4 MG PO TABS: 4.0000 mg | ORAL_TABLET | Freq: Four times a day (QID) | ORAL | Status: DC | PRN

## 2013-11-01 MED ORDER — PANTOPRAZOLE SODIUM 40 MG IV SOLR
40.0000 mg | INTRAVENOUS | Status: DC
  Administered 2013-11-01 – 2013-11-04 (×4): 40 mg via INTRAVENOUS
  Filled 2013-11-01 (×5): qty 40

## 2013-11-01 MED ORDER — ATENOLOL 50 MG PO TABS
50.0000 mg | ORAL_TABLET | Freq: Every day | ORAL | Status: DC
  Administered 2013-11-01 – 2013-11-11 (×11): 50 mg via ORAL
  Filled 2013-11-01 (×11): qty 1

## 2013-11-01 MED ORDER — MUPIROCIN 2 % EX OINT
1.0000 "application " | TOPICAL_OINTMENT | Freq: Two times a day (BID) | CUTANEOUS | Status: AC
  Administered 2013-11-01 – 2013-11-05 (×10): 1 via NASAL
  Filled 2013-11-01: qty 22

## 2013-11-01 MED ORDER — HEPARIN SODIUM (PORCINE) 5000 UNIT/ML IJ SOLN
5000.0000 [IU] | Freq: Three times a day (TID) | INTRAMUSCULAR | Status: DC
  Administered 2013-11-01 – 2013-11-05 (×12): 5000 [IU] via SUBCUTANEOUS
  Filled 2013-11-01 (×16): qty 1

## 2013-11-01 MED ORDER — SPIRONOLACTONE 25 MG PO TABS
25.0000 mg | ORAL_TABLET | Freq: Every morning | ORAL | Status: DC
  Administered 2013-11-01 – 2013-11-02 (×2): 25 mg via ORAL
  Filled 2013-11-01 (×2): qty 1

## 2013-11-01 MED ORDER — METRONIDAZOLE IN NACL 5-0.79 MG/ML-% IV SOLN
500.0000 mg | Freq: Three times a day (TID) | INTRAVENOUS | Status: DC
  Administered 2013-11-01 – 2013-11-05 (×12): 500 mg via INTRAVENOUS
  Filled 2013-11-01 (×16): qty 100

## 2013-11-01 MED ORDER — WARFARIN SODIUM 6 MG PO TABS
6.0000 mg | ORAL_TABLET | Freq: Once | ORAL | Status: AC
  Administered 2013-11-01: 6 mg via ORAL
  Filled 2013-11-01 (×3): qty 1

## 2013-11-01 MED ORDER — LEVOTHYROXINE SODIUM 125 MCG PO TABS
125.0000 ug | ORAL_TABLET | Freq: Every day | ORAL | Status: DC
  Administered 2013-11-01 – 2013-11-11 (×11): 125 ug via ORAL
  Filled 2013-11-01 (×15): qty 1

## 2013-11-01 MED ORDER — ONDANSETRON HCL 4 MG/2ML IJ SOLN: 4.0000 mg | Freq: Three times a day (TID) | INTRAMUSCULAR | Status: AC | PRN

## 2013-11-01 MED ORDER — SODIUM CHLORIDE 0.9 % IJ SOLN
3.0000 mL | Freq: Two times a day (BID) | INTRAMUSCULAR | Status: DC
  Administered 2013-11-01 – 2013-11-11 (×16): 3 mL via INTRAVENOUS

## 2013-11-01 NOTE — Progress Notes (Signed)
Attempted to call for report from the ED at 0018 and 0024.  Each time the RN was unavailable.  Asked that RN call me when she was ready.  Shirley Mccormick Charity fundraiser, [place]

## 2013-11-01 NOTE — ED Notes (Addendum)
IV fluids hooked up to pump, not infusing upon assessment. Pump set to 75 ml/hr. No N/V upon assessment.

## 2013-11-01 NOTE — ED Notes (Signed)
Attempted to give report 

## 2013-11-01 NOTE — Progress Notes (Addendum)
[facility] PROGRESS NOTE  Shirley Mccormick UJW:[id number] DOB: Jun 22, 1925 DOA: 10/31/2013 PCP: Gwen Pounds, MD, resident of [facility]  Brief narrative 79 year old female with history of A. fib on Coumadin, hypothyroidism, peripheral neuropathy, history of recent shingles, who was sent from  nursing home with 2 episodes of fall which was thought to be mechanical. She also started having projectile vomiting on the day of admission. Head CT was unremarkable. Initially a cervical collar was applied and he woke on the floor today and a CT of the cervical spine was unremarkable for any acute injury. Patient also found to have abdominal distention with CT scan of the abdomen and pelvis showing wall thickening along the mid sigmoid colon concerning for infectious versus inflammatory process. Patient also found to have severe hyponatremia.  Assessment/Plan: ?cellulitis of leg On empiric vancomycin. Monitor for improvement   possible abdominal colitis Patient does have abdominal distention on empiric IV Cipro and Flagyl. Monitor with hydration. C. difficile pending. This could explain her vomiting.  Hyponatremia No clear etiology. Sodium recently was within normal limits. No NSAIDs or nephrotoxic meds  used. Possible from dehydration. Check urine sodium, urine osmolality . Low serum osmolality.  Postherpetic neuralgia Hold off Pain medication at this time given her confusion.  A. fib Rate controlled. Coumadin per pharmacy.  Cardiomyopathy No echo in the system. Poorly indapamide and Aldactone for now given hyponatremia. Continue digoxin. Check levels. Continue atenolol  Hypothyroidism.  Continue Synthroid. Check TSH   Dementia Recent MMSE of 20  Code Status: DO NOT RESUSCITATE Family Communication:  Disposition Plan: Return to nursing home upon improvement   Consultants:  None  Procedures:  *None   Antibiotics:  IV vancomycin, Cipro and Flagyl  (12/5)  HPI/Subjective: Admission H&P reviewed. Patient still quite confused and oriented to place only.  Objective: Filed Vitals:   11/01/13 0900  BP: 153/91  Pulse: 84  Temp: 98.1 F (36.7 C)  Resp: 20    Intake/Output Summary (Last 24 hours) at 11/01/13 1325 Last data filed at 11/01/13 0902  Gross per 24 hour  Intake  757.5 ml  Output      0 ml  Net  757.5 ml   Filed Weights   11/01/13 0142  Weight: 91 kg (200 lb 9.9 oz)    Exam:   General:  Elderly female lying in bed in no acute distress  HEENT: No pallor, no icterus, moist oral mucosa  Chest: Clear to auscultation bilaterally, no added sounds  CVS: Normal S1 and S2, no murmurs rub or gallop  Abdomen: Mildly distended, nontender, bowel sounds present  Extremities: Trace pitting edema bilaterally, and edema over right leg  CNS: AAO x1, no focal deficit  Data Reviewed: Basic Metabolic Panel:  Recent Labs Lab 10/27/13 10/31/13 10/31/13 1900 11/01/13 0100 11/01/13 0522  NA 131* 130* 124* 122* 121*  K 3.6 4.6 3.3* 3.7 3.6  CL  --   --  85* 84* 83*  CO2  --   --   --  28 23  GLUCOSE  --   --  128* 122* 115*  BUN 16 12 10 11 10   CREATININE 0.8 0.8 0.80 0.57 0.54  CALCIUM  --   --   --  8.5 8.7   Liver Function Tests:  Recent Labs Lab 11/01/13 0522  AST 23  ALT 13  ALKPHOS 42  BILITOT 0.7  PROT 6.8  ALBUMIN 3.1*   No results found for this basename: LIPASE, AMYLASE,  in the last 168 hours  No results found for this basename: AMMONIA,  in the last 168 hours CBC:  Recent Labs Lab 10/31/13 10/31/13 1845 10/31/13 1900 11/01/13 0522  WBC 5.6 7.5  --  8.0  NEUTROABS  --  5.2  --   --   HGB 11.9* 12.4 13.6 12.6  HCT 34* 35.0* 40.0 35.7*  MCV  --  88.2  --  88.4  PLT 216 222  --  197   Cardiac Enzymes: No results found for this basename: CKTOTAL, CKMB, CKMBINDEX, TROPONINI,  in the last 168 hours BNP (last 3 results)  Recent Labs  10/31/13 1853  PROBNP 4119.0*   CBG: No results  found for this basename: GLUCAP,  in the last 168 hours  Recent Results (from the past 240 hour(s))  MRSA PCR SCREENING     Status: Abnormal   Collection Time    11/01/13  5:45 AM      Result Value Range Status   MRSA by PCR POSITIVE (*) NEGATIVE Final   Comment:            The GeneXpert MRSA Assay (FDA     approved for NASAL specimens     only), is one component of a     comprehensive MRSA colonization     surveillance program. It is not     intended to diagnose MRSA     infection nor to guide or     monitor treatment for     MRSA infections.     RESULT CALLED TO, READ BACK BY AND VERIFIED WITH:     A.RIO,RN 1610 11/01/13 M.CAMPBELL     Studies: Ct Head Wo Contrast  10/31/2013   CLINICAL DATA:  Projectile vomiting this morning.  Fell 3 days ago.  EXAM: CT HEAD WITHOUT CONTRAST  CT CERVICAL SPINE WITHOUT CONTRAST  TECHNIQUE: Multidetector CT imaging of the head and cervical spine was performed following the standard protocol without intravenous contrast. Multiplanar CT image reconstructions of the cervical spine were also generated.  COMPARISON:  None.  FINDINGS: CT HEAD FINDINGS  Diffusely enlarged ventricles and subarachnoid spaces. Sphenoid sinus air-fluid level. No skull fracture or intracranial hemorrhage seen.  CT CERVICAL SPINE FINDINGS  Multilevel degenerative changes. These include facet degenerative changes at multiple levels, most pronounced on the left at the C3-4 level, with associated 3 mm of anterolisthesis at that level. No prevertebral soft tissue swelling or fractures seen. Biapical parenchymal scarring with calcification. Previously noted sphenoid sinus air-fluid level. No skull base fracture visualized.  IMPRESSION: 1. Acute left sphenoid sinusitis vs. blood in the sinus. 2. No skull fracture or intracranial hemorrhage seen. 3. No cervical spine fracture or traumatic subluxation. 4. Multilevel cervical spine degenerative changes. 5. Atrophy and chronic small vessel white  matter ischemic changes in both cerebral hemispheres.   Electronically Signed   By: Gordan Payment M.D.   On: 10/31/2013 20:24   Ct Cervical Spine Wo Contrast  10/31/2013   CLINICAL DATA:  Projectile vomiting this morning.  Fell 3 days ago.  EXAM: CT HEAD WITHOUT CONTRAST  CT CERVICAL SPINE WITHOUT CONTRAST  TECHNIQUE: Multidetector CT imaging of the head and cervical spine was performed following the standard protocol without intravenous contrast. Multiplanar CT image reconstructions of the cervical spine were also generated.  COMPARISON:  None.  FINDINGS: CT HEAD FINDINGS  Diffusely enlarged ventricles and subarachnoid spaces. Sphenoid sinus air-fluid level. No skull fracture or intracranial hemorrhage seen.  CT CERVICAL SPINE FINDINGS  Multilevel degenerative changes. These include facet  degenerative changes at multiple levels, most pronounced on the left at the C3-4 level, with associated 3 mm of anterolisthesis at that level. No prevertebral soft tissue swelling or fractures seen. Biapical parenchymal scarring with calcification. Previously noted sphenoid sinus air-fluid level. No skull base fracture visualized.  IMPRESSION: 1. Acute left sphenoid sinusitis vs. blood in the sinus. 2. No skull fracture or intracranial hemorrhage seen. 3. No cervical spine fracture or traumatic subluxation. 4. Multilevel cervical spine degenerative changes. 5. Atrophy and chronic small vessel white matter ischemic changes in both cerebral hemispheres.   Electronically Signed   By: Gordan Payment M.D.   On: 10/31/2013 20:24   Ct Abdomen Pelvis W Contrast  10/31/2013   CLINICAL DATA:  Abdominal distention, vomiting and fall. Altered mental status.  EXAM: CT ABDOMEN AND PELVIS WITH CONTRAST  TECHNIQUE: Multidetector CT imaging of the abdomen and pelvis was performed using the standard protocol following bolus administration of intravenous contrast.  CONTRAST:  OMNIPAQUE IOHEXOL 300 MG/ML  SOLN  COMPARISON:  Abdominal  radiograph performed earlier today at 7:35 p.m.  FINDINGS: A trace left pleural effusion is noted, with mild bilateral peripheral scarring.  There is a slightly nodular contour to the liver, without definite evidence of cirrhotic change. The spleen is unremarkable in appearance; vascular calcification is noted at the splenic hilum. The gallbladder is decompressed and not well assessed. The pancreas and adrenal glands are within normal limits.  The kidneys are unremarkable in appearance. There is no evidence of hydronephrosis. No renal or ureteral stones are seen. Nonspecific perinephric stranding is noted bilaterally.  No free fluid is identified. The small bowel is unremarkable in appearance. The stomach is within normal limits. No acute vascular abnormalities are seen. Diffuse calcification is noted along the abdominal aorta and its branches, with mild thrombus noted along the distal descending thoracic aorta.  The appendix is normal in caliber, without evidence for appendicitis. There is intermittent distention of the colon, still within normal limits in caliber, with fluid seen filling the mid to distal sigmoid colon and rectum. Apparent mild wall thickening is noted along the mid sigmoid colon, possibly reflecting a mild infectious or inflammatory process. The colon is mildly redundant.  The bladder is mildly distended and grossly unremarkable in appearance. The uterus remains normal in size, with a single small calcified fibroid. The ovaries are relatively symmetric ; no suspicious adnexal masses are seen. Diffuse presacral stranding is nonspecific in appearance. No inguinal lymphadenopathy is seen.  No acute osseous abnormalities are identified. Left convex lumbar scoliosis is noted, with associated vacuum phenomenon.  IMPRESSION: 1. Intermittent distention of the colon, still within normal limits in caliber, with fluid filling the mid to distal sigmoid colon and rectum. Apparent mild wall thickening along  the mid sigmoid colon may reflect a mild infectious or inflammatory process. 2. Trace left pleural effusion, with mild bilateral peripheral scarring. 3. Diffuse calcification along the abdominal aorta and its branches, with mild thrombus along the distal descending thoracic aorta. 4. Nonspecific diffuse presacral stranding noted. 5. Left convex lumbar scoliosis noted.   Electronically Signed   By: Roanna Raider M.D.   On: 10/31/2013 22:52   Dg Abd Acute W/chest  10/31/2013   CLINICAL DATA:  Distended abdomen.  EXAM: ACUTE ABDOMEN SERIES (ABDOMEN 2 VIEW & CHEST 1 VIEW)  COMPARISON:  CT stress set chest radiograph from 4/18/7  FINDINGS: Moderate distension of the large bowel loops identified. No radiopaque calculi or other significant radiographic abnormality is seen. Heart  size is mildly enlarged. There is blunting of the costophrenic angles as well as pulmonary vascular congestion. Both lungs are clear.  IMPRESSION: 1. Moderate distension of the large bowel loops identified. Findings may reflect colonic ileus or distal bowel obstruction. Consider further assessment with CT of the abdomen and pelvis with oral contrast material. 2. Suspect mild CHF.   Electronically Signed   By: Signa Kell M.D.   On: 10/31/2013 19:49    Scheduled Meds: . [COMPLETED] sodium chloride   Intravenous STAT  . atenolol  50 mg Oral Daily  . ciprofloxacin  400 mg Intravenous Q12H  . digoxin  0.125 mg Oral q morning - 10a  . gabapentin  100 mg Oral TID  . heparin  5,000 Units Subcutaneous Q8H  . levothyroxine  125 mcg Oral QAC breakfast  . metronidazole  500 mg Intravenous Q8H  . ondansetron (ZOFRAN) IV  4 mg Intravenous Once  . pantoprazole (PROTONIX) IV  40 mg Intravenous Q24H  . sodium chloride  3 mL Intravenous Q12H  . spironolactone  25 mg Oral q morning - 10a  . vancomycin  1,000 mg Intravenous Q12H  . Warfarin - Pharmacist Dosing Inpatient   Does not apply q1800   Continuous Infusions:     Time spent: 35  minutes    Danyl Deems  [facility] Pager [phone]. If 7PM-7AM, please contact night-coverage at [email or web address], password [facility] 11/01/2013, 1:25 PM  LOS: 1 day

## 2013-11-01 NOTE — Progress Notes (Addendum)
ANTICOAGULATION and ANTIBIOTIC CONSULT NOTE - Initial Consult  Pharmacy Consult for Coumadin and Cipro/Vancocin Indication: atrial fibrillation and r/o colitis, r/o PNA, cellulitis  Allergies  Allergen Reactions  . Erythromycin Rash  . Penicillins Rash  . Tetanus Toxoids Rash    Vital Signs: Temp: 97.8 F (36.6 C) (12/05 1827) Temp src: Oral (12/05 1827) BP: 152/62 mmHg (12/06 0045) Pulse Rate: 83 (12/06 0045)  Labs:  Recent Labs  10/31/13 10/31/13 1845 10/31/13 1900 10/31/13 1918  HGB 11.9* 12.4 13.6  --   HCT 34* 35.0* 40.0  --   PLT 216 222  --   --   APTT  --   --   --  40*  LABPROT  --   --   --  19.5*  INR  --   --   --  1.70*  CREATININE 0.8  --  0.80  --      Medical History: Past Medical History  Diagnosis Date  . Afib   . Shingles 10/01/13  . Pulmonary embolism 1990  . Guillain-Barre 1990    resolved  . Unspecified hypothyroidism   . Neuropathy     Medications:   (Not in a hospital admission) Scheduled:  . ondansetron (ZOFRAN) IV  4 mg Intravenous Once  . warfarin  6 mg Oral Once  . Warfarin - Pharmacist Dosing Inpatient   Does not apply q1800   Infusions:  . ciprofloxacin    . metronidazole      Assessment: 79yo female c/o projectile emesis and AMS, found w/ hyponatremia and bruise on head from fall 3d ago, CT concerning for infectious or inflammatory process within colon, admitted for ABX for suspected colitis, r/o PNA, and cellulitis, also to continue Coumadin for Afib; INR was 3.56 on 12/1 at [facility] and dose held x1 then changed from 6mg  alternating with 7mg  daily to 6mg  daily, now subtherapeutic.  Goal of Therapy:  INR 2-3 Vancomycin trough 15-20   Plan:  Will give Coumadin 6mg  po x1 now and monitor INR for dose adjustments (expect Cipro to increase INR); will begin vancomycin 1000mg  IV Q12H and Cipro 400mg  IV Q12H and monitor CBC, Cx, levels, clinical progression.  Vernard Gambles, PharmD, BCPS  11/01/2013,1:25 AM

## 2013-11-01 NOTE — Progress Notes (Signed)
Utilization Review completed.  

## 2013-11-01 NOTE — Progress Notes (Signed)
Patient arrived to the floor at approximately 0110 from the ED via stretcher.  She is alert to self.  No family is at bedside.  She is anxious but cooperative.  She is [employer] [address].  She is HOH and states she has bilateral hearing aids.  The hearing aids are not with the patient.  She has glasses, a bracelet, and two rings on each hand.  She has significant bilateral lower extremity pitting edema with discoloration.  He has a scabbed over shingles rash to her left flank that wraps around to the left mid abdomen.  Her sacrum is red but blanchable.  She was placed on telemetry.  She had fallen three days prior to admission and has significant bruising to the left forehead above her left eye.  She was placed on Telemetry.  She has no c/o pain.  She is incontinent of urine.  Her abdomen is distended with bowel sounds.  Patient is placed on the bed alarm.  Ortho called to place collar on neck.  Unable to complete majority of admission history due to no family being at bedside.  Will continue to monitor patient.  Murdis Flitton RN, Justine Null.

## 2013-11-02 DIAGNOSIS — E876 Hypokalemia: Secondary | ICD-10-CM

## 2013-11-02 DIAGNOSIS — K5289 Other specified noninfective gastroenteritis and colitis: Secondary | ICD-10-CM

## 2013-11-02 DIAGNOSIS — I509 Heart failure, unspecified: Secondary | ICD-10-CM

## 2013-11-02 LAB — BASIC METABOLIC PANEL
BUN: 11 mg/dL (ref 6–23)
Calcium: 8 mg/dL — ABNORMAL LOW (ref 8.4–10.5)
Creatinine, Ser: 0.54 mg/dL (ref 0.50–1.10)
GFR calc Af Amer: >90 mL/min (ref >90)
GFR calc non Af Amer: 83 mL/min — ABNORMAL LOW (ref >90)
Glucose, Bld: 124 mg/dL — ABNORMAL HIGH (ref 70–99)
Sodium: 121 mEq/L — ABNORMAL LOW (ref 135–145)

## 2013-11-02 LAB — PROTIME-INR: Prothrombin Time: 21.7 seconds — ABNORMAL HIGH (ref 11.6–15.2)

## 2013-11-02 MED ORDER — POTASSIUM CHLORIDE CRYS ER 20 MEQ PO TBCR
40.0000 meq | EXTENDED_RELEASE_TABLET | Freq: Once | ORAL | Status: AC
  Administered 2013-11-02: 40 meq via ORAL
  Filled 2013-11-02: qty 2

## 2013-11-02 MED ORDER — ACETAMINOPHEN 325 MG PO TABS
650.0000 mg | ORAL_TABLET | Freq: Four times a day (QID) | ORAL | Status: DC | PRN
  Administered 2013-11-02 – 2013-11-08 (×6): 650 mg via ORAL
  Filled 2013-11-02 (×6): qty 2

## 2013-11-02 MED ORDER — POLYETHYLENE GLYCOL 3350 17 G PO PACK
17.0000 g | PACK | Freq: Every day | ORAL | Status: DC
  Administered 2013-11-02 – 2013-11-11 (×9): 17 g via ORAL
  Filled 2013-11-02 (×10): qty 1

## 2013-11-02 MED ORDER — FUROSEMIDE 10 MG/ML IJ SOLN
40.0000 mg | Freq: Once | INTRAMUSCULAR | Status: AC
  Administered 2013-11-02: 40 mg via INTRAVENOUS
  Filled 2013-11-02: qty 4

## 2013-11-02 MED ORDER — WARFARIN SODIUM 5 MG PO TABS
5.0000 mg | ORAL_TABLET | Freq: Once | ORAL | Status: AC
  Administered 2013-11-02: 5 mg via ORAL
  Filled 2013-11-02: qty 1

## 2013-11-02 NOTE — Progress Notes (Signed)
[facility] PROGRESS NOTE  KYRIE BUN WUJ:[id number] DOB: [birth year] DOA: 10/31/2013 PCP: Gwen Pounds, MD  Brief narrative  79 year old female with history of A. fib on Coumadin, hypothyroidism, peripheral neuropathy, history of recent shingles, who was sent from nursing home with 2 episodes of fall which was thought to be mechanical. She also started having projectile vomiting on the day of admission. Head CT was unremarkable. Initially a cervical collar was applied and he woke on the floor today and a CT of the cervical spine was unremarkable for any acute injury. Patient also found to have abdominal distention with CT scan of the abdomen and pelvis showing wall thickening along the mid sigmoid colon concerning for infectious versus inflammatory process. Patient also found to have severe hyponatremia.   Assessment/Plan:   Hyponatremia  No clear etiology. Sodium recently was within normal limits. No NSAIDs or nephrotoxic meds used. Did not improve with IV hydration. Possibly SIADH. Serum osmolality was low. pending urine sodium, urine osmolality .  -Will discontinue IV fluids and restrict her fluid intake to less than 1200 cc per day -hold indapamide. Given a dose of IV Lasix given mild fluid overload     possible abdominal colitis  Patient does have abdominal distention with CT findings of infectious versus inflammation to be colitis. on empiric IV Cipro and Flagyl. Monitor with hydration. C. difficile pending. Vomiting has resolved. Patient has good bowel sounds. Will add bowel regimen. -Serial abdominal exam.  ?cellulitis of leg  Symptom not a convincing and appears more likely related to her leg edema. Will discontinue vancomycin.   Hypokalemia replenish with KCl  Postherpetic neuralgia  Hold off Pain medication at this time given her confusion.   A. fib  Rate controlled. Coumadin per pharmacy.   Cardiomyopathy  No echo in the system. Hold indapamide and Aldactone  for now given hyponatremia. Continue digoxin. Continue atenolol  -Ordered podiatric:  Hypothyroidism.  Continue Synthroid. Normal TSH  Dementia  Recent MMSE of 20   Code Status: DO NOT RESUSCITATE  Family Communication: Spoke with her husband over the phone Disposition Plan: Return to nursing home upon improvement .  Consultants:  None Procedures:  None  Antibiotics:  IV vancomycin, (12 /5- 12/7) Cipro and Flagyl (12/5--)  HPI/Subjective:  Patient to better oriented today. Denies any symptoms.   Objective: Filed Vitals:   11/02/13 0953  BP: 162/85  Pulse: 85  Temp: 98 F (36.7 C)  Resp: 18    Intake/Output Summary (Last 24 hours) at 11/02/13 1034 Last data filed at 11/02/13 0900  Gross per 24 hour  Intake   1340 ml  Output      0 ml  Net   1340 ml   Filed Weights   11/01/13 0142 11/01/13 2045  Weight: 91 kg (200 lb 9.9 oz) 91 kg (200 lb 9.9 oz)    Exam:  General: Elderly female lying in bed in no acute distress  HEENT: No pallor, no icterus, moist oral mucosa  Chest: Clear to auscultation bilaterally, no added sounds  CVS: Normal S1 and S2, no murmurs rub or gallop  Abdomen:  distended, nontender, bowel sounds present  Extremities: 1+ pitting edema bilaterally,   CNS: AAO x1-2,  no focal deficit   Data Reviewed: Basic Metabolic Panel:  Recent Labs Lab 10/31/13 10/31/13 1900 11/01/13 0100 11/01/13 0522 11/02/13 0545  NA 130* 124* 122* 121* 121*  K 4.6 3.3* 3.7 3.6 3.2*  CL  --  85* 84* 83* 82*  CO2  --   --  28 23 27   GLUCOSE  --  128* 122* 115* 124*  BUN 12 10 11 10 11   CREATININE 0.8 0.80 0.57 0.54 0.54  CALCIUM  --   --  8.5 8.7 8.0*   Liver Function Tests:  Recent Labs Lab 11/01/13 0522  AST 23  ALT 13  ALKPHOS 42  BILITOT 0.7  PROT 6.8  ALBUMIN 3.1*   No results found for this basename: LIPASE, AMYLASE,  in the last 168 hours No results found for this basename: AMMONIA,  in the last 168 hours CBC:  Recent Labs Lab  10/31/13 10/31/13 1845 10/31/13 1900 11/01/13 0522  WBC 5.6 7.5  --  8.0  NEUTROABS  --  5.2  --   --   HGB 11.9* 12.4 13.6 12.6  HCT 34* 35.0* 40.0 35.7*  MCV  --  88.2  --  88.4  PLT 216 222  --  197   Cardiac Enzymes: No results found for this basename: CKTOTAL, CKMB, CKMBINDEX, TROPONINI,  in the last 168 hours BNP (last 3 results)  Recent Labs  10/31/13 1853  PROBNP 4119.0*   CBG: No results found for this basename: GLUCAP,  in the last 168 hours  Recent Results (from the past 240 hour(s))  MRSA PCR SCREENING     Status: Abnormal   Collection Time    11/01/13  5:45 AM      Result Value Range Status   MRSA by PCR POSITIVE (*) NEGATIVE Final   Comment:            The GeneXpert MRSA Assay (FDA     approved for NASAL specimens     only), is one component of a     comprehensive MRSA colonization     surveillance program. It is not     intended to diagnose MRSA     infection nor to guide or     monitor treatment for     MRSA infections.     RESULT CALLED TO, READ BACK BY AND VERIFIED WITH:     A.RIO,RN [id number] 11/01/13 M.CAMPBELL     Studies: Ct Head Wo Contrast  10/31/2013   CLINICAL DATA:  Projectile vomiting this morning.  Fell 3 days ago.  EXAM: CT HEAD WITHOUT CONTRAST  CT CERVICAL SPINE WITHOUT CONTRAST  TECHNIQUE: Multidetector CT imaging of the head and cervical spine was performed following the standard protocol without intravenous contrast. Multiplanar CT image reconstructions of the cervical spine were also generated.  COMPARISON:  None.  FINDINGS: CT HEAD FINDINGS  Diffusely enlarged ventricles and subarachnoid spaces. Sphenoid sinus air-fluid level. No skull fracture or intracranial hemorrhage seen.  CT CERVICAL SPINE FINDINGS  Multilevel degenerative changes. These include facet degenerative changes at multiple levels, most pronounced on the left at the C3-4 level, with associated 3 mm of anterolisthesis at that level. No prevertebral soft tissue swelling or  fractures seen. Biapical parenchymal scarring with calcification. Previously noted sphenoid sinus air-fluid level. No skull base fracture visualized.  IMPRESSION: 1. Acute left sphenoid sinusitis vs. blood in the sinus. 2. No skull fracture or intracranial hemorrhage seen. 3. No cervical spine fracture or traumatic subluxation. 4. Multilevel cervical spine degenerative changes. 5. Atrophy and chronic small vessel white matter ischemic changes in both cerebral hemispheres.   Electronically Signed   By: Gordan Payment M.D.   On: 10/31/2013 20:24   Ct Cervical Spine Wo Contrast  10/31/2013   CLINICAL DATA:  Projectile vomiting this morning.  Fell 3 days ago.  EXAM: CT HEAD WITHOUT CONTRAST  CT CERVICAL SPINE WITHOUT CONTRAST  TECHNIQUE: Multidetector CT imaging of the head and cervical spine was performed following the standard protocol without intravenous contrast. Multiplanar CT image reconstructions of the cervical spine were also generated.  COMPARISON:  None.  FINDINGS: CT HEAD FINDINGS  Diffusely enlarged ventricles and subarachnoid spaces. Sphenoid sinus air-fluid level. No skull fracture or intracranial hemorrhage seen.  CT CERVICAL SPINE FINDINGS  Multilevel degenerative changes. These include facet degenerative changes at multiple levels, most pronounced on the left at the C3-4 level, with associated 3 mm of anterolisthesis at that level. No prevertebral soft tissue swelling or fractures seen. Biapical parenchymal scarring with calcification. Previously noted sphenoid sinus air-fluid level. No skull base fracture visualized.  IMPRESSION: 1. Acute left sphenoid sinusitis vs. blood in the sinus. 2. No skull fracture or intracranial hemorrhage seen. 3. No cervical spine fracture or traumatic subluxation. 4. Multilevel cervical spine degenerative changes. 5. Atrophy and chronic small vessel white matter ischemic changes in both cerebral hemispheres.   Electronically Signed   By: Gordan Payment M.D.   On: 10/31/2013  20:24   Ct Abdomen Pelvis W Contrast  10/31/2013   CLINICAL DATA:  Abdominal distention, vomiting and fall. Altered mental status.  EXAM: CT ABDOMEN AND PELVIS WITH CONTRAST  TECHNIQUE: Multidetector CT imaging of the abdomen and pelvis was performed using the standard protocol following bolus administration of intravenous contrast.  CONTRAST:  OMNIPAQUE IOHEXOL 300 MG/ML  SOLN  COMPARISON:  Abdominal radiograph performed earlier today at 7:35 p.m.  FINDINGS: A trace left pleural effusion is noted, with mild bilateral peripheral scarring.  There is a slightly nodular contour to the liver, without definite evidence of cirrhotic change. The spleen is unremarkable in appearance; vascular calcification is noted at the splenic hilum. The gallbladder is decompressed and not well assessed. The pancreas and adrenal glands are within normal limits.  The kidneys are unremarkable in appearance. There is no evidence of hydronephrosis. No renal or ureteral stones are seen. Nonspecific perinephric stranding is noted bilaterally.  No free fluid is identified. The small bowel is unremarkable in appearance. The stomach is within normal limits. No acute vascular abnormalities are seen. Diffuse calcification is noted along the abdominal aorta and its branches, with mild thrombus noted along the distal descending thoracic aorta.  The appendix is normal in caliber, without evidence for appendicitis. There is intermittent distention of the colon, still within normal limits in caliber, with fluid seen filling the mid to distal sigmoid colon and rectum. Apparent mild wall thickening is noted along the mid sigmoid colon, possibly reflecting a mild infectious or inflammatory process. The colon is mildly redundant.  The bladder is mildly distended and grossly unremarkable in appearance. The uterus remains normal in size, with a single small calcified fibroid. The ovaries are relatively symmetric ; no suspicious adnexal masses are  seen. Diffuse presacral stranding is nonspecific in appearance. No inguinal lymphadenopathy is seen.  No acute osseous abnormalities are identified. Left convex lumbar scoliosis is noted, with associated vacuum phenomenon.  IMPRESSION: 1. Intermittent distention of the colon, still within normal limits in caliber, with fluid filling the mid to distal sigmoid colon and rectum. Apparent mild wall thickening along the mid sigmoid colon may reflect a mild infectious or inflammatory process. 2. Trace left pleural effusion, with mild bilateral peripheral scarring. 3. Diffuse calcification along the abdominal aorta and its branches, with mild thrombus along the distal descending thoracic aorta. 4. Nonspecific diffuse presacral stranding  noted. 5. Left convex lumbar scoliosis noted.   Electronically Signed   By: Roanna Raider M.D.   On: 10/31/2013 22:52   Dg Abd Acute W/chest  10/31/2013   CLINICAL DATA:  Distended abdomen.  EXAM: ACUTE ABDOMEN SERIES (ABDOMEN 2 VIEW & CHEST 1 VIEW)  COMPARISON:  CT stress set chest radiograph from 4/18/7  FINDINGS: Moderate distension of the large bowel loops identified. No radiopaque calculi or other significant radiographic abnormality is seen. Heart size is mildly enlarged. There is blunting of the costophrenic angles as well as pulmonary vascular congestion. Both lungs are clear.  IMPRESSION: 1. Moderate distension of the large bowel loops identified. Findings may reflect colonic ileus or distal bowel obstruction. Consider further assessment with CT of the abdomen and pelvis with oral contrast material. 2. Suspect mild CHF.   Electronically Signed   By: Signa Kell M.D.   On: 10/31/2013 19:49    Scheduled Meds: . atenolol  50 mg Oral Daily  . Chlorhexidine Gluconate Cloth  6 each Topical Q0600  . ciprofloxacin  400 mg Intravenous Q12H  . digoxin  0.125 mg Oral q morning - 10a  . furosemide  40 mg Intravenous Once  . gabapentin  100 mg Oral TID  . heparin  5,000 Units  Subcutaneous Q8H  . levothyroxine  125 mcg Oral QAC breakfast  . metronidazole  500 mg Intravenous Q8H  . mupirocin ointment  1 application Nasal BID  . ondansetron (ZOFRAN) IV  4 mg Intravenous Once  . pantoprazole (PROTONIX) IV  40 mg Intravenous Q24H  . polyethylene glycol  17 g Oral Daily  . sodium chloride  3 mL Intravenous Q12H  . spironolactone  25 mg Oral q morning - 10a  . Warfarin - Pharmacist Dosing Inpatient   Does not apply q1800   Continuous Infusions:    Time spent: 25 minutes    Kaisa Wofford  [facility] Pager [phone] If 7PM-7AM, please contact night-coverage at [email or web address], password [facility] 11/02/2013, 10:34 AM  LOS: 2 days

## 2013-11-02 NOTE — Progress Notes (Signed)
ANTICOAGULATION and ANTIBIOTIC CONSULT NOTE - Initial Consult  Pharmacy Consult for Coumadin  Indication: Afib  Allergies  Allergen Reactions  . Erythromycin Rash  . Penicillins Rash  . Tetanus Toxoids Rash    Vital Signs: Temp: 98 F (36.7 C) (12/07 0953) Temp src: Oral (12/07 0953) BP: 162/85 mmHg (12/07 0953) Pulse Rate: 85 (12/07 0953)  Labs:  Recent Labs  10/31/13 10/31/13 1845  10/31/13 1900 10/31/13 1918 11/01/13 0100 11/01/13 0522 11/02/13 0545  HGB 11.9* 12.4  --  13.6  --   --  12.6  --   HCT 34* 35.0*  --  40.0  --   --  35.7*  --   PLT 216 222  --   --   --   --  197  --   APTT  --   --   --   --  40*  --   --   --   LABPROT  --   --   --   --  19.5*  --  19.9* 21.7*  INR  --   --   --   --  1.70*  --  1.75* 1.96*  CREATININE 0.8  --   < > 0.80  --  0.57 0.54 0.54  < > = values in this interval not displayed.   Medical History: Past Medical History  Diagnosis Date  . Afib   . Shingles 10/01/13  . Pulmonary embolism 1990  . Guillain-Barre 1990    resolved  . Unspecified hypothyroidism   . Neuropathy     Medications:  Prescriptions prior to admission  Medication Sig Dispense Refill  . acetaminophen (TYLENOL) 325 MG tablet Take 650 mg by mouth every 6 (six) hours as needed for mild pain.      Marland Kitchen atenolol (TENORMIN) 50 MG tablet Take 50 mg by mouth daily.      . cholecalciferol (VITAMIN D) 1000 UNITS tablet Take 1,000 Units by mouth every morning.       . digoxin (LANOXIN) 0.125 MG tablet Take 0.125 mg by mouth every morning.       . diphenhydramine-acetaminophen (TYLENOL PM) 25-500 MG TABS Take 2 tablets by mouth at bedtime.      . fish oil-omega-3 fatty acids 1000 MG capsule Take 1 g by mouth 2 (two) times daily.       Marland Kitchen gabapentin (NEURONTIN) 100 MG capsule Take 100 mg by mouth 3 (three) times daily.      Marland Kitchen glucosamine-chondroitin 500-400 MG tablet Take 1 tablet by mouth 3 (three) times daily.      Marland Kitchen HYDROcodone-acetaminophen (NORCO/VICODIN)  5-325 MG per tablet Take 1 tablet by mouth every 6 (six) hours.      . hydrOXYzine (ATARAX/VISTARIL) 25 MG tablet Take 25 mg by mouth every 8 (eight) hours as needed for itching.      . indapamide (LOZOL) 2.5 MG tablet Take 2.5 mg by mouth every morning.      Marland Kitchen ipratropium-albuterol (DUONEB) 0.5-2.5 (3) MG/3ML SOLN Take 3 mLs by nebulization 3 (three) times daily. For 3 days only. START 12.3.14 END 12.5.14      . levothyroxine (SYNTHROID, LEVOTHROID) 125 MCG tablet Take 125 mcg by mouth daily.      . Multiple Vitamins-Minerals (CERTAVITE/ANTIOXIDANTS PO) Take 1 tablet by mouth every morning.      . potassium chloride (K-DUR) 10 MEQ tablet Take 10 mEq by mouth 2 (two) times daily.      Marland Kitchen spironolactone (ALDACTONE) 25 MG tablet Take  25 mg by mouth every morning.       . warfarin (COUMADIN) 1 MG tablet Take 7 mg by mouth daily at 6 PM.      . warfarin (COUMADIN) 6 MG tablet Take 6 mg by mouth daily at 6 PM.       Scheduled:  . atenolol  50 mg Oral Daily  . Chlorhexidine Gluconate Cloth  6 each Topical Q0600  . ciprofloxacin  400 mg Intravenous Q12H  . digoxin  0.125 mg Oral q morning - 10a  . furosemide  40 mg Intravenous Once  . gabapentin  100 mg Oral TID  . heparin  5,000 Units Subcutaneous Q8H  . levothyroxine  125 mcg Oral QAC breakfast  . metronidazole  500 mg Intravenous Q8H  . mupirocin ointment  1 application Nasal BID  . ondansetron (ZOFRAN) IV  4 mg Intravenous Once  . pantoprazole (PROTONIX) IV  40 mg Intravenous Q24H  . polyethylene glycol  17 g Oral Daily  . sodium chloride  3 mL Intravenous Q12H  . Warfarin - Pharmacist Dosing Inpatient   Does not apply q1800    Assessment: 79yo female continued Coumadin for Afib; INR 1.96 this morning, no new cbc, No bleeding noted per chart. On D#2 flagyl and cipro, they both can decrease coumadin metabolism.  PTA dose: alternating 6mg /7mg , last dose 12/4  Goal of Therapy:  INR 2-3 Vancomycin trough 15-20   Plan:  - Coumadin 5mg   po x1  - f/u INR  Bayard Hugger, PharmD, BCPS  Clinical Pharmacist  Pager: [phone]   11/02/2013,10:55 AM

## 2013-11-03 ENCOUNTER — Inpatient Hospital Stay (HOSPITAL_COMMUNITY): Payer: Medicare Other

## 2013-11-03 DIAGNOSIS — I369 Nonrheumatic tricuspid valve disorder, unspecified: Secondary | ICD-10-CM

## 2013-11-03 LAB — OSMOLALITY, URINE: Osmolality, Ur: 450 mOsm/kg (ref 390–1090)

## 2013-11-03 LAB — BASIC METABOLIC PANEL
CO2: 26 mEq/L (ref 19–32)
Calcium: 8 mg/dL — ABNORMAL LOW (ref 8.4–10.5)
Chloride: 82 mEq/L — ABNORMAL LOW (ref 96–112)
Creatinine, Ser: 0.68 mg/dL (ref 0.50–1.10)
Glucose, Bld: 106 mg/dL — ABNORMAL HIGH (ref 70–99)

## 2013-11-03 LAB — PROTIME-INR: INR: 1.94 — ABNORMAL HIGH (ref 0.00–1.49)

## 2013-11-03 LAB — SODIUM, URINE, RANDOM: Sodium, Ur: 37 mEq/L

## 2013-11-03 MED ORDER — ENSURE COMPLETE PO LIQD
237.0000 mL | Freq: Two times a day (BID) | ORAL | Status: DC
  Administered 2013-11-03 – 2013-11-11 (×11): 237 mL via ORAL

## 2013-11-03 MED ORDER — WARFARIN SODIUM 6 MG PO TABS
6.0000 mg | ORAL_TABLET | Freq: Once | ORAL | Status: AC
  Administered 2013-11-03: 6 mg via ORAL
  Filled 2013-11-03: qty 1

## 2013-11-03 MED ORDER — POTASSIUM CHLORIDE CRYS ER 20 MEQ PO TBCR
40.0000 meq | EXTENDED_RELEASE_TABLET | Freq: Once | ORAL | Status: AC
  Administered 2013-11-03: 40 meq via ORAL
  Filled 2013-11-03: qty 2

## 2013-11-03 NOTE — Consult Note (Signed)
Shirley Mccormick 11/03/2013 Shirley Mccormick, B Requesting Physician:  Dhungel  Reason for Consult:  hypnatremia HPI:  62F w/ dementia, SNF resident, admitted to [facility] 12/5 after a fall with N/V.  W/u has suggested a colitis and pt recently with shingles and some PHN.  Now on Flagyl and Cipro.  Admission Na was 130, then 125 on same day.  5d PTA and 46mo PTA was 131, 133, respectively.  Pt initially treated for mild hypovolemia / TZD induced issue with NS and held diuretic but serum Na did not change.  Has subsequently been mildly fluid restricted.  Urine studies pending.  TSH WNL during admission.  BP has been normal or high.   Pt on heart healthy diet, no Na restriction.    Currently pt complains of some pain in abdomen.  Is incontinent of urine.  Denies thirst.  Eating very poorly: half of her oatmeal this AM with full assistance.    Sodium (mmol/L)  Date Value  11/03/2013 121*  11/02/2013 121*  11/01/2013 121*  11/01/2013 122*  10/31/2013 124*  10/31/2013 130*  10/27/2013 131*  10/02/2013 133*  10/01/2013 131*   I/Os: I/O last 3 completed shifts: In: 1960 [P.O.:980; Other:180; IV Piggyback:800] Out: -    ROS Balance of 12 systems is negative w/ exceptions as above  PMH  Past Medical History  Diagnosis Date  . Afib   . Shingles 10/01/13  . Pulmonary embolism 1990  . Guillain-Barre 1990    resolved  . Unspecified hypothyroidism   . Neuropathy    PSH  Past Surgical History  Procedure Laterality Date  . Breast lumpectomy Right 1965    Dr. Judithe Modest   FH History reviewed. No pertinent family history. SH  reports that she has never smoked. She does not have any smokeless tobacco history on file. She reports that she does not drink alcohol or use illicit drugs. Allergies  Allergies  Allergen Reactions  . Erythromycin Rash  . Penicillins Rash  . Tetanus Toxoids Rash   Home medications Prior to Admission medications   Medication Sig Start Date End Date Taking? Authorizing  Provider  acetaminophen (TYLENOL) 325 MG tablet Take 650 mg by mouth every 6 (six) hours as needed for mild pain.   Yes Historical Provider, MD  atenolol (TENORMIN) 50 MG tablet Take 50 mg by mouth daily.   Yes Historical Provider, MD  cholecalciferol (VITAMIN D) 1000 UNITS tablet Take 1,000 Units by mouth every morning.    Yes Historical Provider, MD  digoxin (LANOXIN) 0.125 MG tablet Take 0.125 mg by mouth every morning.    Yes Historical Provider, MD  diphenhydramine-acetaminophen (TYLENOL PM) 25-500 MG TABS Take 2 tablets by mouth at bedtime.   Yes Historical Provider, MD  fish oil-omega-3 fatty acids 1000 MG capsule Take 1 g by mouth 2 (two) times daily.    Yes Historical Provider, MD  gabapentin (NEURONTIN) 100 MG capsule Take 100 mg by mouth 3 (three) times daily.   Yes Historical Provider, MD  glucosamine-chondroitin 500-400 MG tablet Take 1 tablet by mouth 3 (three) times daily.   Yes Historical Provider, MD  HYDROcodone-acetaminophen (NORCO/VICODIN) 5-325 MG per tablet Take 1 tablet by mouth every 6 (six) hours.   Yes Historical Provider, MD  hydrOXYzine (ATARAX/VISTARIL) 25 MG tablet Take 25 mg by mouth every 8 (eight) hours as needed for itching.   Yes Historical Provider, MD  indapamide (LOZOL) 2.5 MG tablet Take 2.5 mg by mouth every morning.   Yes Historical Provider,  MD  ipratropium-albuterol (DUONEB) 0.5-2.5 (3) MG/3ML SOLN Take 3 mLs by nebulization 3 (three) times daily. For 3 days only. START 12.3.14 END 12.5.14   Yes Historical Provider, MD  levothyroxine (SYNTHROID, LEVOTHROID) 125 MCG tablet Take 125 mcg by mouth daily.   Yes Historical Provider, MD  Multiple Vitamins-Minerals (CERTAVITE/ANTIOXIDANTS PO) Take 1 tablet by mouth every morning.   Yes Historical Provider, MD  potassium chloride (K-DUR) 10 MEQ tablet Take 10 mEq by mouth 2 (two) times daily.   Yes Historical Provider, MD  spironolactone (ALDACTONE) 25 MG tablet Take 25 mg by mouth every morning.    Yes Historical  Provider, MD  warfarin (COUMADIN) 1 MG tablet Take 7 mg by mouth daily at 6 PM.   Yes Historical Provider, MD  warfarin (COUMADIN) 6 MG tablet Take 6 mg by mouth daily at 6 PM.   Yes Historical Provider, MD    Current Medications Current Facility-Administered Medications  Medication Dose Route Frequency Provider Last Rate Last Dose  . acetaminophen (TYLENOL) tablet 650 mg  650 mg Oral Q6H PRN Rolan Lipa, NP   650 mg at 11/02/13 2029  . albuterol (PROVENTIL) (5 MG/ML) 0.5% nebulizer solution 2.5 mg  2.5 mg Nebulization Q4H PRN Rolan Lipa, NP   2.5 mg at 11/01/13 4098  . atenolol (TENORMIN) tablet 50 mg  50 mg Oral Daily Lynden Oxford, MD   50 mg at 11/03/13 0950  . Chlorhexidine Gluconate Cloth 2 % PADS 6 each  6 each Topical Q0600 Nishant Dhungel, MD   6 each at 11/03/13 0512  . ciprofloxacin (CIPRO) IVPB 400 mg  400 mg Intravenous Q12H Colleen Can, RPH   400 mg at 11/03/13 1109  . digoxin (LANOXIN) tablet 0.125 mg  0.125 mg Oral q morning - 10a Lynden Oxford, MD   0.125 mg at 11/03/13 0950  . gabapentin (NEURONTIN) capsule 100 mg  100 mg Oral TID Lynden Oxford, MD   100 mg at 11/03/13 0950  . heparin injection 5,000 Units  5,000 Units Subcutaneous Q8H Lynden Oxford, MD   5,000 Units at 11/03/13 0510  . levothyroxine (SYNTHROID, LEVOTHROID) tablet 125 mcg  125 mcg Oral QAC breakfast Lynden Oxford, MD   125 mcg at 11/03/13 0801  . metroNIDAZOLE (FLAGYL) IVPB 500 mg  500 mg Intravenous Q8H Lynden Oxford, MD   500 mg at 11/03/13 0956  . mupirocin ointment (BACTROBAN) 2 % 1 application  1 application Nasal BID Eddie North, MD   1 application at 11/03/13 0955  . ondansetron (ZOFRAN) injection 4 mg  4 mg Intravenous Once Vida Roller, MD      . ondansetron [facility]) tablet 4 mg  4 mg Oral Q6H PRN Lynden Oxford, MD       Or  . ondansetron (ZOFRAN) injection 4 mg  4 mg Intravenous Q6H PRN Lynden Oxford, MD      . pantoprazole (PROTONIX) injection 40 mg  40 mg  Intravenous Q24H Lynden Oxford, MD   40 mg at 11/03/13 0950  . polyethylene glycol (MIRALAX / GLYCOLAX) packet 17 g  17 g Oral Daily Nishant Dhungel, MD   17 g at 11/03/13 0955  . sodium chloride 0.9 % injection 3 mL  3 mL Intravenous Q12H Lynden Oxford, MD   3 mL at 11/01/13 1043  . warfarin (COUMADIN) tablet 6 mg  6 mg Oral ONCE-1800 Anh P Pham, RPH      . Warfarin - Pharmacist Dosing Inpatient   Does not apply [phone] [facility]  Hollie Salk, [facility]        CBC  Recent Labs Lab 10/31/13 10/31/13 1845 10/31/13 1900 11/01/13 0522  WBC 5.6 7.5  --  8.0  NEUTROABS  --  5.2  --   --   HGB 11.9* 12.4 13.6 12.6  HCT 34* 35.0* 40.0 35.7*  MCV  --  88.2  --  88.4  PLT 216 222  --  197   Basic Metabolic Panel  Recent Labs Lab 10/31/13 10/31/13 1900 11/01/13 0100 11/01/13 0522 11/02/13 0545 11/03/13 0624  NA 130* 124* 122* 121* 121* 121*  K 4.6 3.3* 3.7 3.6 3.2* 3.3*  CL  --  85* 84* 83* 82* 82*  CO2  --   --  28 23 27 26   GLUCOSE  --  128* 122* 115* 124* 106*  BUN 12 10 11 10 11 14   CREATININE 0.8 0.80 0.57 0.54 0.54 0.68  CALCIUM  --   --  8.5 8.7 8.0* 8.0*    Physical Exam  Blood pressure 160/70, pulse 70, temperature 98.2 F (36.8 C), temperature source Oral, resp. rate 18, height 5' 10.5" (1.791 m), weight 91 kg (200 lb 9.9 oz), SpO2 98.00%. GEN: frail appearing, lying in bed, asking for help.   ENT: Has ecchymosis on L forehead EYES: wearing glasses CV: IRIR.  Normal rate. PULM: ctab, nl wob ABD: distended, tender, soft, +BS SKIN: no rashes EXT: 2-3+ pitting edema  A/P: 27F admitted with fall, abd pain, N/V and imaging suggestive of colitis.  Has baseline mild hypnatremia, worsened since admission.    1. Hypotonic Euvolemic Hyponatremia: I suspect this is SIADH with her acute medical issues.  Volume status doesn't appear overtly low.  She did not respond to a volume challenge.  I also think she has very poor solute intake compounding this limiting her free water excretion.  I  have added on uric acid and will place foley for urine studies and accurate I/Os.  Cont with fluid restriction; if drops further will have to further restrict.  As best able we should encourage her to eat and she doesn't need Na restriction.  Follow Serum na for now.  No indication for vaptan.  No indication for HT Saline.  No clear causative medications.  Shirley Heck MD 11/03/2013, 12:49 PM

## 2013-11-03 NOTE — Progress Notes (Addendum)
INITIAL NUTRITION ASSESSMENT  DOCUMENTATION CODES Per approved criteria  -Not Applicable   INTERVENTION:  1. Recommend liberalize diet to Regular (paged MD)  2. Ensure Complete po BID, each supplement provides 350 kcal and 13 grams of protein.  NUTRITION DIAGNOSIS: Inadequate oral intake related to poor appetite as evidenced by Meal Completion: <25%.   Goal: Pt to meet >/= 90% of their estimated nutrition needs   Monitor:  PO intake, supplement acceptance, weight trend, labs  Reason for Assessment: Pt identified as at nutrition risk on the Malnutrition Screen Tool  79 y.o. female  Admitting Dx: Cellulitis  ASSESSMENT: Pt admitted from SNF after a fall. Pt with possible cellulitis of her leg, hypokalemia, hyponatremia (possibly SIADH), and possible abdominal colitis. C.diff studies pending. Pt with abdominal distention.  Pt very tired having just worked with therapy. States she does not feel well and has no appetite. Pt feels that her appetite was fine PTA and is unsure of any weight loss. She is at her usual weight but pt has abdominal distention and swelling (3+) of her lower extremities possibly masking any weight loss.   Nutrition Focused Physical Exam:  Subcutaneous Fat:  Orbital Region: WNL Upper Arm Region: WNL Thoracic and Lumbar Region: WNL  Muscle:  Temple Region: WNL Clavicle Bone Region: WNL Clavicle and Acromion Bone Region: WNL Scapular Bone Region: WNL Dorsal Hand: WNL Patellar Region: WNL Anterior Thigh Region: WNL Posterior Calf Region: swollen  Edema: 3+   Height: Ht Readings from Last 1 Encounters:  11/01/13 5' 10.5" (1.791 m)    Weight: Wt Readings from Last 1 Encounters:  11/02/13 200 lb 9.9 oz (91 kg)    Ideal Body Weight: 69 kg  % Ideal Body Weight: 132%  Wt Readings from Last 10 Encounters:  11/02/13 200 lb 9.9 oz (91 kg)  10/02/13 200 lb (90.719 kg)  11/04/12 200 lb (90.719 kg)    Usual Body Weight: 200 lb   % Usual Body  Weight: 100%  BMI:  Body mass index is 28.37 kg/(m^2).  Estimated Nutritional Needs: Kcal: 1750-1900 Protein: 85-95 grams Fluid: > 1.5 L/day  Skin: swelling possibly cellulitis of bilateral lower extremities   Diet Order: Cardiac Meal Completion: 0-10%   EDUCATION NEEDS: -No education needs identified at this time   Intake/Output Summary (Last 24 hours) at 11/03/13 1345 Last data filed at 11/03/13 1322  Gross per 24 hour  Intake   1130 ml  Output      0 ml  Net   1130 ml    Last BM: 12/7   Labs:   Recent Labs Lab 11/01/13 0522 11/02/13 0545 11/03/13 0624  NA 121* 121* 121*  K 3.6 3.2* 3.3*  CL 83* 82* 82*  CO2 23 27 26   BUN 10 11 14   CREATININE 0.54 0.54 0.68  CALCIUM 8.7 8.0* 8.0*  GLUCOSE 115* 124* 106*    CBG (last 3)  No results found for this basename: GLUCAP,  in the last 72 hours  Scheduled Meds: . atenolol  50 mg Oral Daily  . Chlorhexidine Gluconate Cloth  6 each Topical Q0600  . ciprofloxacin  400 mg Intravenous Q12H  . digoxin  0.125 mg Oral q morning - 10a  . gabapentin  100 mg Oral TID  . heparin  5,000 Units Subcutaneous Q8H  . levothyroxine  125 mcg Oral QAC breakfast  . metronidazole  500 mg Intravenous Q8H  . mupirocin ointment  1 application Nasal BID  . ondansetron (ZOFRAN) IV  4  mg Intravenous Once  . pantoprazole (PROTONIX) IV  40 mg Intravenous Q24H  . polyethylene glycol  17 g Oral Daily  . sodium chloride  3 mL Intravenous Q12H  . warfarin  6 mg Oral ONCE-1800  . Warfarin - Pharmacist Dosing Inpatient   Does not apply q1800    Continuous Infusions:   Past Medical History  Diagnosis Date  . Afib   . Shingles 10/01/13  . Pulmonary embolism 1990  . Guillain-Barre 1990    resolved  . Unspecified hypothyroidism   . Neuropathy     Past Surgical History  Procedure Laterality Date  . Breast lumpectomy Right 1965    Dr. Judithe Modest    Kendell Bane RD, LDN, CNSC [phone] Pager [phone] After Hours Pager

## 2013-11-03 NOTE — Progress Notes (Signed)
   CARE MANAGEMENT NOTE 11/03/2013  Patient:  Shirley Mccormick, Shirley Mccormick   Account Number:  [id number]  Date Initiated:  11/03/2013  Documentation initiated by:  Darlyne Russian  Subjective/Objective Assessment:   admitted from [facility] s/p fall, hyponatremia, abdominal distention/ pan colities, redness and swelling left lower leg/ cellulitis     Action/Plan:   Progression of care and discharge planning for return to [facility].   Anticipated DC Date:     Anticipated DC Plan:  SKILLED NURSING FACILITY  In-house referral  Clinical Social Worker         Choice offered to / List presented to:             Status of service:  In process, will continue to follow Medicare Important Message given?   (If response is "NO", the following Medicare IM given date fields will be blank) Date Medicare IM given:   Date Additional Medicare IM given:    Discharge Disposition:  SKILLED NURSING FACILITY  Per UR Regulation:    If discussed at Long Length of Stay Meetings, dates discussed:    Comments:

## 2013-11-03 NOTE — Progress Notes (Signed)
[facility] PROGRESS NOTE  Shirley Mccormick:[id number] DOB: October 09, 1925 DOA: 10/31/2013 PCP: Gwen Pounds, MD  Brief narrative  79 year old female with history of A. fib on Coumadin, hypothyroidism, peripheral neuropathy, history of recent shingles, who was sent from nursing home with 2 episodes of fall which was thought to be mechanical. She also started having projectile vomiting on the day of admission. Head CT was unremarkable. Initially a cervical collar was applied and he woke on the floor today and a CT of the cervical spine was unremarkable for any acute injury. Patient also found to have abdominal distention with CT scan of the abdomen and pelvis showing wall thickening along the mid sigmoid colon concerning for infectious versus inflammatory process. Patient also found to have severe hyponatremia.   Assessment/Plan:  Hyponatremia  No clear etiology. Sodium recently was within normal limits. No NSAIDs or nephrotoxic meds used. Did not improve with IV hydration. Possibly SIADH. Placed on fluid restriction without much improvement. Serum osmolality was low. pending urine sodium, urine osmolality .  -Appreciated the recommendation. Of these this is more likely [facility] pediatric dental continue fluid restriction and placed on regular diet. Recommend foley with close urine output monitoring. -hold indapamide.  -Check uric acid  possible abdominal colitis  Patient does have abdominal distention with CT findings of infectious versus inflammation of admission.Marland Kitchen on empiric IV Cipro and Flagyl.  C. difficile pending. no  diarrhea. Vomiting has resolved. Abdominal x-ray shows distended bowel gas. I will order for an enema .-Serial abdominal exam.   ?cellulitis of leg  Symptom not a convincing and appears more likely related to her leg edema. Will discontinue vancomycin.   Hypokalemia  replenish with KCl   Postherpetic neuralgia  Hold off Pain medication at this time given her confusion.    A. fib  Rate controlled. Coumadin per pharmacy.    Cardiomyopathy  No echo in the system. Hold indapamide and Aldactone for now given hyponatremia. Continue digoxin. Continue atenolol  Check 2-D echo  Hypothyroidism.  Continue Synthroid. Normal TSH   Dementia  Recent MMSE of 20   Code Status: DO NOT RESUSCITATE  Family Communication: Spoke with her husband over the phone on 12/7 Disposition Plan: Return to nursing home upon improvement .   Consultants:  None  Procedures:  None   Antibiotics:  IV vancomycin, (12 /5- 12/7)  Cipro and Flagyl (12/5--) HPI/Subjective:  Patient denies any significant symptoms. No overnight issues   Objective: Filed Vitals:   11/03/13 1300  BP: 145/74  Pulse: 70  Temp: 98 F (36.7 C)  Resp:     Intake/Output Summary (Last 24 hours) at 11/03/13 1513 Last data filed at 11/03/13 1338  Gross per 24 hour  Intake   1190 ml  Output      0 ml  Net   1190 ml   Filed Weights   11/01/13 2045 11/02/13 2047 11/03/13 1405  Weight: 91 kg (200 lb 9.9 oz) 91 kg (200 lb 9.9 oz) 91.173 kg (201 lb)    Exam:  General: Elderly female lying in bed in no acute distress  HEENT: No pallor, no icterus, moist oral mucosa  Chest: Clear to auscultation bilaterally, no added sounds  CVS: Normal S1 and S2, no murmurs rub or gallop  Abdomen: distended, tympanic note to percussion, nontender, bowel sounds present  Extremities: 1+ pitting edema bilaterally,  CNS: AAO x2, no focal deficit   Data Reviewed: Basic Metabolic Panel:  Recent Labs Lab 10/31/13 1900 11/01/13 0100 11/01/13 0522  11/02/13 0545 11/03/13 0624  NA 124* 122* 121* 121* 121*  K 3.3* 3.7 3.6 3.2* 3.3*  CL 85* 84* 83* 82* 82*  CO2  --  28 23 27 26   GLUCOSE 128* 122* 115* 124* 106*  BUN 10 11 10 11 14   CREATININE 0.80 0.57 0.54 0.54 0.68  CALCIUM  --  8.5 8.7 8.0* 8.0*   Liver Function Tests:  Recent Labs Lab 11/01/13 0522  AST 23  ALT 13  ALKPHOS 42  BILITOT 0.7   PROT 6.8  ALBUMIN 3.1*   No results found for this basename: LIPASE, AMYLASE,  in the last 168 hours No results found for this basename: AMMONIA,  in the last 168 hours CBC:  Recent Labs Lab 10/31/13 10/31/13 1845 10/31/13 1900 11/01/13 0522  WBC 5.6 7.5  --  8.0  NEUTROABS  --  5.2  --   --   HGB 11.9* 12.4 13.6 12.6  HCT 34* 35.0* 40.0 35.7*  MCV  --  88.2  --  88.4  PLT 216 222  --  197   Cardiac Enzymes: No results found for this basename: CKTOTAL, CKMB, CKMBINDEX, TROPONINI,  in the last 168 hours BNP (last 3 results)  Recent Labs  10/31/13 1853  PROBNP 4119.0*   CBG: No results found for this basename: GLUCAP,  in the last 168 hours  Recent Results (from the past 240 hour(s))  MRSA PCR SCREENING     Status: Abnormal   Collection Time    11/01/13  5:45 AM      Result Value Range Status   MRSA by PCR POSITIVE (*) NEGATIVE Final   Comment:            The GeneXpert MRSA Assay (FDA     approved for NASAL specimens     only), is one component of a     comprehensive MRSA colonization     surveillance program. It is not     intended to diagnose MRSA     infection nor to guide or     monitor treatment for     MRSA infections.     RESULT CALLED TO, READ BACK BY AND VERIFIED WITH:     A.RIO,RN [id number] 11/01/13 M.CAMPBELL     Studies: Dg Abd Portable 1v  11/03/2013   CLINICAL DATA:  Abdominal distension question obstruction  EXAM: PORTABLE ABDOMEN - 1 VIEW  COMPARISON:  Portable exam 1218 hr compared to 10/31/2013  FINDINGS: Air-filled loops of large and small bowel throughout abdomen.  No definite bowel wall thickening.  Bones appear diffusely demineralized with mild scattered degenerative changes and scoliosis of the lumbar spine.  No pathologic calcifications.  IMPRESSION: Nonspecific bowel gas pattern with air-filled loops of large and small bowel throughout abdomen.  No definite evidence of obstruction or wall thickening.   Electronically Signed   By: Ulyses Southward  M.D.   On: 11/03/2013 14:21    Scheduled Meds: . atenolol  50 mg Oral Daily  . Chlorhexidine Gluconate Cloth  6 each Topical Q0600  . ciprofloxacin  400 mg Intravenous Q12H  . digoxin  0.125 mg Oral q morning - 10a  . gabapentin  100 mg Oral TID  . heparin  5,000 Units Subcutaneous Q8H  . levothyroxine  125 mcg Oral QAC breakfast  . metronidazole  500 mg Intravenous Q8H  . mupirocin ointment  1 application Nasal BID  . ondansetron (ZOFRAN) IV  4 mg Intravenous Once  . pantoprazole (PROTONIX) IV  40 mg Intravenous Q24H  . polyethylene glycol  17 g Oral Daily  . sodium chloride  3 mL Intravenous Q12H  . warfarin  6 mg Oral ONCE-1800  . Warfarin - Pharmacist Dosing Inpatient   Does not apply q1800   Continuous Infusions:     Time spent: 25 minutes    Zadiel Leyh  [facility] Pager [phone]. If 7PM-7AM, please contact night-coverage at [email or web address], password [facility] 11/03/2013, 3:13 PM  LOS: 3 days

## 2013-11-03 NOTE — Progress Notes (Signed)
ANTICOAGULATION CONSULT NOTE - Follow Up Consult  Pharmacy Consult for coumadin Indication: atrial fibrillation  Allergies  Allergen Reactions  . Erythromycin Rash  . Penicillins Rash  . Tetanus Toxoids Rash    Patient Measurements: Height: 5' 10.5" (179.1 cm) (per patient) Weight: 200 lb 9.9 oz (91 kg) IBW/kg (Calculated) : 69.65   Vital Signs: Temp: 98.1 F (36.7 C) (12/08 0509) Temp src: Oral (12/08 0509) BP: 167/72 mmHg (12/08 0509) Pulse Rate: 68 (12/08 0509)  Labs:  Recent Labs  10/31/13 1845 10/31/13 1900  10/31/13 1918  11/01/13 0522 11/02/13 0545 11/03/13 0610 11/03/13 0624  HGB 12.4 13.6  --   --   --  12.6  --   --   --   HCT 35.0* 40.0  --   --   --  35.7*  --   --   --   PLT 222  --   --   --   --  197  --   --   --   APTT  --   --   --  40*  --   --   --   --   --   LABPROT  --   --   < > 19.5*  --  19.9* 21.7* 21.6*  --   INR  --   --   < > 1.70*  --  1.75* 1.96* 1.94*  --   CREATININE  --  0.80  --   --   < > 0.54 0.54  --  0.68  < > = values in this interval not displayed.  Estimated Creatinine Clearance: 62.3 ml/min (by C-G formula based on Cr of 0.68).  Assessment: Patient is an 79 y.o F on coumadin for Afib.  INR is slightly below goal at 1.94 this morning.  No bleeding noted.  Goal of Therapy:  INR 2-3    Plan:  1) coumadin 6mg  PO x1 today 2) will monitor INR closely since patient's patient also on cipro and flagyl   Trinidee Schrag P 11/03/2013,9:34 AM

## 2013-11-03 NOTE — Progress Notes (Signed)
  Echocardiogram 2D Echocardiogram has been performed.  Shirley Mccormick Shirley Mccormick 11/03/2013, 6:54 PM

## 2013-11-03 NOTE — Evaluation (Signed)
Physical Therapy Evaluation Patient Details Name: Shirley Mccormick MRN: [id number] DOB: [birth year] Today's Date: 11/03/2013 Time: 2130-8657 PT Time Calculation (min): 18 min  PT Assessment / Plan / Recommendation History of Present Illness    Pt is a 79 y.o. Female admitted from [facility] s/p fall, hyponatremia, abdominal distention/ pan colities, redness and swelling left lower leg/ cellulitis    Clinical Impression  Pt adm due to the above. Pt limited in evaluation today due to pain and fatigue. Was able to tolerate sitting EOB with total (A) to achieve sitting position and return to supine position. Pt to benefit from skilled PT during acute stay to address deficits listed below (see PT problem list). Pt plan to return to [facility].     PT Assessment  Patient needs continued PT services    Follow Up Recommendations  SNF;Supervision/Assistance - 24 hour    Does the patient have the potential to tolerate intense rehabilitation      Barriers to Discharge   pt lives at [facility]    Equipment Recommendations  None recommended by PT    Recommendations for Other Services OT consult   Frequency Min 2X/week    Precautions / Restrictions Precautions Precautions: Fall Restrictions Weight Bearing Restrictions: No   Pertinent Vitals/Pain C/o pain; unable to pinpoint location. States "i just hurt"       Mobility  Bed Mobility Bed Mobility: Supine to Sit;Sitting - Scoot to Edge of Bed;Sit to Supine Supine to Sit: 1: +1 Total assist;HOB elevated;With rails Sitting - Scoot to Edge of Bed: 1: +1 Total assist Sit to Supine: 1: +1 Total assist;HOB flat Details for Bed Mobility Assistance: pt with difficulty advancing LEs due to increased swelling and generalized weakness; requires total (A) with use of draw pad to bring pt to sitting position at EOB; pt thrusting posteriorly contstantly  Transfers Transfers: Not assessed Ambulation/Gait Ambulation/Gait Assistance: Not tested  (comment) Stairs: No    Exercises General Exercises - Lower Extremity Ankle Circles/Pumps: AROM;Both;10 reps;Supine   PT Diagnosis: Difficulty walking;Generalized weakness;Acute pain  PT Problem List: Decreased strength;Decreased balance;Decreased activity tolerance;Decreased mobility;Decreased cognition;Decreased knowledge of use of DME;Decreased safety awareness;Pain PT Treatment Interventions: DME instruction;Gait training;Functional mobility training;Therapeutic exercise;Therapeutic activities;Balance training;Neuromuscular re-education;Patient/family education     PT Goals(Current goals can be found in the care plan section) Acute Rehab PT Goals Patient Stated Goal: none stated PT Goal Formulation: With patient Time For Goal Achievement: 11/17/13 Potential to Achieve Goals: Fair  Visit Information  Last PT Received On: 11/03/13 Assistance Needed: +2       Prior Functioning  Home Living Family/patient expects to be discharged to:: Skilled nursing facility Additional Comments: Lives at [facility] Prior Function Level of Independence: Needs assistance Comments: pt is a poor historian; no family present to determine baseline. pt does report she has fallen trying stand up multiple times Communication Communication: HOH    Cognition  Cognition Arousal/Alertness: Awake/alert Behavior During Therapy: WFL for tasks assessed/performed Overall Cognitive Status: No family/caregiver present to determine baseline cognitive functioning Memory: Decreased short-term memory    Extremity/Trunk Assessment Upper Extremity Assessment Upper Extremity Assessment: Defer to OT evaluation Lower Extremity Assessment Lower Extremity Assessment: Generalized weakness Cervical / Trunk Assessment Cervical / Trunk Assessment: Kyphotic   Balance Balance Balance Assessed: Yes Static Sitting Balance Static Sitting - Balance Support: Bilateral upper extremity supported;Feet  unsupported Static Sitting - Level of Assistance: 2: Max assist Static Sitting - Comment/# of Minutes: tolerated sitting ~5 min; pt required  total (A) to maintain balance; pt leaning Lt and posteriorly; limited by pain and fatigue   End of Session PT - End of Session Activity Tolerance: Patient limited by fatigue;Patient limited by pain Patient left: in bed;with call bell/phone within reach Nurse Communication: Mobility status;Need for lift equipment  GP     Donell Sievert, [place] [phone] 11/03/2013, 3:43 PM

## 2013-11-04 DIAGNOSIS — I4891 Unspecified atrial fibrillation: Secondary | ICD-10-CM

## 2013-11-04 LAB — BASIC METABOLIC PANEL
BUN: 14 mg/dL (ref 6–23)
Calcium: 7.8 mg/dL — ABNORMAL LOW (ref 8.4–10.5)
Creatinine, Ser: 0.64 mg/dL (ref 0.50–1.10)
GFR calc non Af Amer: 79 mL/min — ABNORMAL LOW (ref >90)
Glucose, Bld: 100 mg/dL — ABNORMAL HIGH (ref 70–99)
Potassium: 3.4 mEq/L — ABNORMAL LOW (ref 3.5–5.1)
Sodium: 121 mEq/L — ABNORMAL LOW (ref 135–145)

## 2013-11-04 LAB — PROTIME-INR: Prothrombin Time: 20.1 seconds — ABNORMAL HIGH (ref 11.6–15.2)

## 2013-11-04 MED ORDER — HYDRALAZINE HCL 10 MG PO TABS
10.0000 mg | ORAL_TABLET | Freq: Three times a day (TID) | ORAL | Status: DC
  Administered 2013-11-04 – 2013-11-08 (×11): 10 mg via ORAL
  Filled 2013-11-04 (×17): qty 1

## 2013-11-04 MED ORDER — FUROSEMIDE 20 MG PO TABS
20.0000 mg | ORAL_TABLET | Freq: Two times a day (BID) | ORAL | Status: DC
  Administered 2013-11-04 – 2013-11-08 (×8): 20 mg via ORAL
  Filled 2013-11-04 (×11): qty 1

## 2013-11-04 MED ORDER — WARFARIN SODIUM 6 MG PO TABS
7.0000 mg | ORAL_TABLET | Freq: Once | ORAL | Status: AC
  Administered 2013-11-04: 7 mg via ORAL
  Filled 2013-11-04: qty 1

## 2013-11-04 MED ORDER — SODIUM CHLORIDE 1 G PO TABS
2.0000 g | ORAL_TABLET | Freq: Two times a day (BID) | ORAL | Status: DC
  Administered 2013-11-04 – 2013-11-08 (×8): 2 g via ORAL
  Filled 2013-11-04 (×10): qty 2

## 2013-11-04 MED ORDER — POTASSIUM CHLORIDE CRYS ER 20 MEQ PO TBCR
40.0000 meq | EXTENDED_RELEASE_TABLET | Freq: Once | ORAL | Status: AC
  Administered 2013-11-04: 40 meq via ORAL
  Filled 2013-11-04: qty 2

## 2013-11-04 NOTE — Progress Notes (Signed)
Admit: 10/31/2013 LOS: 4  47F with hypotonic euvolemic hyponatremia, SIADH, insetting of dementia, SNF residence, and admitted w/ fall and ? Colitis.    Subjective:  NAEON Pt conversant, confused U Osm 450.  U Na 37.  Uric acid 5.4  12/08 0701 - 12/09 0700 In: 390 [P.O.:390] Out: 550 [Urine:550]  Filed Weights   11/02/13 2047 11/03/13 1405 11/03/13 2053  Weight: 91 kg (200 lb 9.9 oz) 91.173 kg (201 lb) 91.173 kg (201 lb)    Current meds: reviewed including gabapent, stopped Current Labs: reviewed   Physical Exam:  Blood pressure 154/57, pulse 80, temperature 97.4 F (36.3 C), temperature source Oral, resp. rate 16, height 5' 10.5" (1.791 m), weight 91.173 kg (201 lb), SpO2 96.00%. GEN: frail appearing, lying in bed,  ENT: Has ecchymosis on L forehead  EYES: wearing glasses  CV: IRIR. Normal rate.  PULM: ctab, nl wob  ABD: distended, tender, soft, +BS  SKIN: no rashes  EXT: 2-3+ pitting edema  Assessment/Plan 1. SIADH: Pt with little solute intake.  Will start NaCl tabs 2gm BID and add low dose lasix to reduce urinary concentration.  Cont with fluid restrictin.  No indication for vaptan or 3% saline.  Hold gabapentin as well.    Sabra Heck MD 11/04/2013, 2:05 PM   Recent Labs Lab 11/02/13 0545 11/03/13 0624 11/04/13 0550  NA 121* 121* 121*  K 3.2* 3.3* 3.4*  CL 82* 82* 84*  CO2 27 26 26   GLUCOSE 124* 106* 100*  BUN 11 14 14   CREATININE 0.54 0.68 0.64  CALCIUM 8.0* 8.0* 7.8*    Recent Labs Lab 10/31/13 10/31/13 1845 10/31/13 1900 11/01/13 0522  WBC 5.6 7.5  --  8.0  NEUTROABS  --  5.2  --   --   HGB 11.9* 12.4 13.6 12.6  HCT 34* 35.0* 40.0 35.7*  MCV  --  88.2  --  88.4  PLT 216 222  --  197    Current Facility-Administered Medications  Medication Dose Route Frequency Provider Last Rate Last Dose  . acetaminophen (TYLENOL) tablet 650 mg  650 mg Oral Q6H PRN Rolan Lipa, NP   650 mg at 11/03/13 2210  . albuterol (PROVENTIL) (5 MG/ML)  0.5% nebulizer solution 2.5 mg  2.5 mg Nebulization Q4H PRN Rolan Lipa, NP   2.5 mg at 11/01/13 1610  . atenolol (TENORMIN) tablet 50 mg  50 mg Oral Daily Lynden Oxford, MD   50 mg at 11/04/13 1145  . Chlorhexidine Gluconate Cloth 2 % PADS 6 each  6 each Topical Q0600 Nishant Dhungel, MD   6 each at 11/04/13 0657  . ciprofloxacin (CIPRO) IVPB 400 mg  400 mg Intravenous Q12H Colleen Can, RPH   400 mg at 11/04/13 1320  . digoxin (LANOXIN) tablet 0.125 mg  0.125 mg Oral q morning - 10a Lynden Oxford, MD   0.125 mg at 11/04/13 1145  . feeding supplement (ENSURE COMPLETE) (ENSURE COMPLETE) liquid 237 mL  237 mL Oral BID BM Heather Cornelison Pitts, RD   237 mL at 11/03/13 1600  . furosemide (LASIX) tablet 20 mg  20 mg Oral BID Arita Miss, MD      . heparin injection 5,000 Units  5,000 Units Subcutaneous Q8H Lynden Oxford, MD   5,000 Units at 11/04/13 [phone]  . hydrALAZINE (APRESOLINE) tablet 10 mg  10 mg Oral Q8H Nishant Dhungel, MD      . levothyroxine (SYNTHROID, LEVOTHROID) tablet 125 mcg  125 mcg Oral QAC  breakfast Lynden Oxford, MD   125 mcg at 11/04/13 1147  . metroNIDAZOLE (FLAGYL) IVPB 500 mg  500 mg Intravenous Q8H Lynden Oxford, MD   500 mg at 11/04/13 1150  . mupirocin ointment (BACTROBAN) 2 % 1 application  1 application Nasal BID Nishant Dhungel, MD   1 application at 11/04/13 1145  . ondansetron (ZOFRAN) injection 4 mg  4 mg Intravenous Once Vida Roller, MD      . ondansetron [facility]) tablet 4 mg  4 mg Oral Q6H PRN Lynden Oxford, MD       Or  . ondansetron (ZOFRAN) injection 4 mg  4 mg Intravenous Q6H PRN Lynden Oxford, MD      . pantoprazole (PROTONIX) injection 40 mg  40 mg Intravenous Q24H Lynden Oxford, MD   40 mg at 11/04/13 1148  . polyethylene glycol (MIRALAX / GLYCOLAX) packet 17 g  17 g Oral Daily Nishant Dhungel, MD   17 g at 11/04/13 1145  . sodium chloride 0.9 % injection 3 mL  3 mL Intravenous Q12H Lynden Oxford, MD   3 mL at 11/04/13 1201  . warfarin  (COUMADIN) tablet 7 mg  7 mg Oral ONCE-1800 Anh P Pham, RPH      . Warfarin - Pharmacist Dosing Inpatient   Does not apply [address], [facility]

## 2013-11-04 NOTE — Progress Notes (Signed)
[facility] PROGRESS NOTE  Shirley Mccormick [id number] DOB: Aug 17, 1925 DOA: 10/31/2013 PCP: Gwen Pounds, MD  Brief narrative  79 year old female with history of A. fib on Coumadin, hypothyroidism, peripheral neuropathy, history of recent shingles, who was sent from nursing home with 2 episodes of fall which was thought to be mechanical. She also started having projectile vomiting on the day of admission. Head CT was unremarkable. Initially a cervical collar was applied and he woke on the floor today and a CT of the cervical spine was unremarkable for any acute injury. Patient also found to have abdominal distention with CT scan of the abdomen and pelvis showing wall thickening along the mid sigmoid colon concerning for infectious versus inflammatory process. Patient also found to have severe hyponatremia.    Assessment/Plan:  Hyponatremia  - Sodium recently was within normal limits. No NSAIDs or nephrotoxic meds used. Did not improve with IV hydration. Possibly SIADH given normal. Placed on fluid restriction without much improvement. ( for past 72 hrs) .Serum osmolality was low. normal urine sodium and urine osmolality .  -Appreciated renal recommendation.  Agree this to be most likely SIADH. Recommend to continue fluid restriction and placed on regular diet. Recommend foley with close urine output monitoring. May need salt tablets if sodium doesnot improve in next 24-28 hrs despite adequate fluid restriction. Will defer to renal. -hold indapamide.  -TSH and  uric acid normal .  possible abdominal colitis  Patient does have abdominal distention with CT findings of infectious versus inflammation of admission.Marland Kitchen on empiric IV Cipro and Flagyl.  no further diarrhea. Vomiting has resolved. Abdominal x-ray repeated on 12/8 shows distended bowel gas. Ordered enema on 12/8. -Serial abdominal exam.     Hypokalemia  replenish with KCl   Postherpetic neuralgia  Hold off Pain medication at this  time given her confusion.   A. fib  Rate controlled. Coumadin per pharmacy.   Cardiomyopathy  . Hold indapamide and Aldactone for now given hyponatremia. Continue digoxin. Continue atenolol   2-D echo results pending.  Hypothyroidism.  Continue Synthroid. Normal TSH   Dementia  Recent MMSE of 20   Code Status: DO NOT RESUSCITATE   Family Communication: Spoke with her husband over the phone on 12/8  Disposition Plan: Return to nursing home upon improvement of her sodium and abdominal distention .   Consultants:  Renal    Procedures:  None   Antibiotics:  IV vancomycin, (12 /5- 12/7)  Cipro and Flagyl (12/5--) plan to treat for 7 days  HPI/Subjective:   No overnight issues . Reportedly had BM after enema yesterday     Objective: Filed Vitals:   11/04/13 1015  BP: 154/57  Pulse: 80  Temp: 97.4 F (36.3 C)  Resp: 16    Intake/Output Summary (Last 24 hours) at 11/04/13 1325 Last data filed at 11/04/13 0900  Gross per 24 hour  Intake    420 ml  Output    550 ml  Net   -130 ml   Filed Weights   11/02/13 2047 11/03/13 1405 11/03/13 2053  Weight: 91 kg (200 lb 9.9 oz) 91.173 kg (201 lb) 91.173 kg (201 lb)    Exam:  General: Elderly female lying in bed in no acute distress  HEENT: No pallor, no icterus, moist oral mucosa  Chest: Clear to auscultation bilaterally, no added sounds  CVS: Normal S1 and S2, no murmurs rub or gallop  Abdomen: moderately distended, tympanic note to percussion, nontender, bowel sounds present  Extremities: trace  edema bilaterally,  CNS: AAO x2, no focal deficit   Data Reviewed: Basic Metabolic Panel:  Recent Labs Lab 11/01/13 0100 11/01/13 0522 11/02/13 0545 11/03/13 0624 11/04/13 0550  NA 122* 121* 121* 121* 121*  K 3.7 3.6 3.2* 3.3* 3.4*  CL 84* 83* 82* 82* 84*  CO2 28 23 27 26 26   GLUCOSE 122* 115* 124* 106* 100*  BUN 11 10 11 14 14   CREATININE 0.57 0.54 0.54 0.68 0.64  CALCIUM 8.5 8.7 8.0* 8.0* 7.8*   Liver  Function Tests:  Recent Labs Lab 11/01/13 0522  AST 23  ALT 13  ALKPHOS 42  BILITOT 0.7  PROT 6.8  ALBUMIN 3.1*   No results found for this basename: LIPASE, AMYLASE,  in the last 168 hours No results found for this basename: AMMONIA,  in the last 168 hours CBC:  Recent Labs Lab 10/31/13 10/31/13 1845 10/31/13 1900 11/01/13 0522  WBC 5.6 7.5  --  8.0  NEUTROABS  --  5.2  --   --   HGB 11.9* 12.4 13.6 12.6  HCT 34* 35.0* 40.0 35.7*  MCV  --  88.2  --  88.4  PLT 216 222  --  197   Cardiac Enzymes: No results found for this basename: CKTOTAL, CKMB, CKMBINDEX, TROPONINI,  in the last 168 hours BNP (last 3 results)  Recent Labs  10/31/13 1853  PROBNP 4119.0*   CBG: No results found for this basename: GLUCAP,  in the last 168 hours  Recent Results (from the past 240 hour(s))  MRSA PCR SCREENING     Status: Abnormal   Collection Time    11/01/13  5:45 AM      Result Value Range Status   MRSA by PCR POSITIVE (*) NEGATIVE Final   Comment:            The GeneXpert MRSA Assay (FDA     approved for NASAL specimens     only), is one component of a     comprehensive MRSA colonization     surveillance program. It is not     intended to diagnose MRSA     infection nor to guide or     monitor treatment for     MRSA infections.     RESULT CALLED TO, READ BACK BY AND VERIFIED WITH:     A.RIO,RN 1610 11/01/13 M.CAMPBELL     Studies: Dg Abd Portable 1v  11/03/2013   CLINICAL DATA:  Abdominal distension question obstruction  EXAM: PORTABLE ABDOMEN - 1 VIEW  COMPARISON:  Portable exam 1218 hr compared to 10/31/2013  FINDINGS: Air-filled loops of large and small bowel throughout abdomen.  No definite bowel wall thickening.  Bones appear diffusely demineralized with mild scattered degenerative changes and scoliosis of the lumbar spine.  No pathologic calcifications.  IMPRESSION: Nonspecific bowel gas pattern with air-filled loops of large and small bowel throughout abdomen.  No  definite evidence of obstruction or wall thickening.   Electronically Signed   By: Ulyses Southward M.D.   On: 11/03/2013 14:21    Scheduled Meds: . atenolol  50 mg Oral Daily  . Chlorhexidine Gluconate Cloth  6 each Topical Q0600  . ciprofloxacin  400 mg Intravenous Q12H  . digoxin  0.125 mg Oral q morning - 10a  . feeding supplement (ENSURE COMPLETE)  237 mL Oral BID BM  . gabapentin  100 mg Oral TID  . heparin  5,000 Units Subcutaneous Q8H  . hydrALAZINE  10 mg Oral Q8H  .  levothyroxine  125 mcg Oral QAC breakfast  . metronidazole  500 mg Intravenous Q8H  . mupirocin ointment  1 application Nasal BID  . ondansetron (ZOFRAN) IV  4 mg Intravenous Once  . pantoprazole (PROTONIX) IV  40 mg Intravenous Q24H  . polyethylene glycol  17 g Oral Daily  . sodium chloride  3 mL Intravenous Q12H  . warfarin  7 mg Oral ONCE-1800  . Warfarin - Pharmacist Dosing Inpatient   Does not apply q1800   Continuous Infusions:     Time spent: 25 minutes    Kansas Spainhower  [facility] Pager [phone]. If 7PM-7AM, please contact night-coverage at [email or web address], password [facility] 11/04/2013, 1:25 PM  LOS: 4 days

## 2013-11-04 NOTE — Progress Notes (Signed)
ANTICOAGULATION and ANTIBIOTIC CONSULT NOTE - Follow Up Consult  Pharmacy Consult for coumadin; cipro Indication: atrial fibrillation; suspected colitis  Allergies  Allergen Reactions  . Erythromycin Rash  . Penicillins Rash  . Tetanus Toxoids Rash    Patient Measurements: Height: 5' 10.5" (179.1 cm) (per patient) Weight: 201 lb (91.173 kg) IBW/kg (Calculated) : 69.65   Vital Signs: Temp: 97.5 F (36.4 C) (12/09 0452) Temp src: Oral (12/09 0452) BP: 148/90 mmHg (12/09 0452) Pulse Rate: 86 (12/09 0452)  Labs:  Recent Labs  11/02/13 0545 11/03/13 0610 11/03/13 0624 11/04/13 0550  LABPROT 21.7* 21.6*  --  20.1*  INR 1.96* 1.94*  --  1.77*  CREATININE 0.54  --  0.68 0.64    Estimated Creatinine Clearance: 62.4 ml/min (by C-G formula based on Cr of 0.64).  Assessment: Patient is an 79 y.o F on coumadin for Afib.  INR is down slightly to 1.77.  No bleeding documented.  Patient is also on ciprofloxacin and flagyl for empiric coverage for suspected colitis.  No cultures.  SCr is stable at 0.64 (est crcl~ 62).  Goal of Therapy:  INR 2-3    Plan:  1) coumadin 7mg  PO x1 today 2) no change for Cipro dose  Jasleen Riepe P 11/04/2013,8:58 AM

## 2013-11-05 DIAGNOSIS — K529 Noninfective gastroenteritis and colitis, unspecified: Secondary | ICD-10-CM | POA: Diagnosis present

## 2013-11-05 DIAGNOSIS — E039 Hypothyroidism, unspecified: Secondary | ICD-10-CM

## 2013-11-05 DIAGNOSIS — I10 Essential (primary) hypertension: Secondary | ICD-10-CM | POA: Diagnosis present

## 2013-11-05 LAB — BASIC METABOLIC PANEL
BUN: 14 mg/dL (ref 6–23)
CO2: 24 mEq/L (ref 19–32)
Calcium: 7.8 mg/dL — ABNORMAL LOW (ref 8.4–10.5)
Chloride: 87 mEq/L — ABNORMAL LOW (ref 96–112)
Creatinine, Ser: 0.62 mg/dL (ref 0.50–1.10)
Glucose, Bld: 98 mg/dL (ref 70–99)
Sodium: 125 mEq/L — ABNORMAL LOW (ref 135–145)

## 2013-11-05 LAB — PROTIME-INR: INR: 2.44 — ABNORMAL HIGH (ref 0.00–1.49)

## 2013-11-05 MED ORDER — CIPROFLOXACIN HCL 500 MG PO TABS
500.0000 mg | ORAL_TABLET | Freq: Two times a day (BID) | ORAL | Status: DC
  Administered 2013-11-05 – 2013-11-09 (×8): 500 mg via ORAL
  Filled 2013-11-05 (×11): qty 1

## 2013-11-05 MED ORDER — POTASSIUM CHLORIDE CRYS ER 20 MEQ PO TBCR
40.0000 meq | EXTENDED_RELEASE_TABLET | Freq: Once | ORAL | Status: AC
  Administered 2013-11-05: 40 meq via ORAL
  Filled 2013-11-05: qty 2

## 2013-11-05 MED ORDER — HYDRALAZINE HCL 20 MG/ML IJ SOLN: 2.0000 mg | Freq: Four times a day (QID) | INTRAMUSCULAR | Status: DC | PRN

## 2013-11-05 MED ORDER — WARFARIN SODIUM 5 MG PO TABS
5.0000 mg | ORAL_TABLET | Freq: Once | ORAL | Status: AC
  Administered 2013-11-05: 5 mg via ORAL
  Filled 2013-11-05: qty 1

## 2013-11-05 MED ORDER — METRONIDAZOLE 500 MG PO TABS
500.0000 mg | ORAL_TABLET | Freq: Three times a day (TID) | ORAL | Status: DC
  Administered 2013-11-05 – 2013-11-09 (×11): 500 mg via ORAL
  Filled 2013-11-05 (×15): qty 1

## 2013-11-05 NOTE — Progress Notes (Signed)
[facility] PROGRESS NOTE  Shirley Mccormick [id number] DOB: 08-Jan-1925 DOA: 10/31/2013 PCP: Gwen Pounds, MD  Brief narrative  79 year old female with history of A. fib on Coumadin, hypothyroidism, peripheral neuropathy, history of recent shingles, who was sent from nursing home with 2 episodes of fall which was thought to be mechanical. She also started having projectile vomiting on the day of admission. Head CT was unremarkable. Initially a cervical collar was applied and he woke on the floor today and a CT of the cervical spine was unremarkable for any acute injury. Patient also found to have abdominal distention with CT scan of the abdomen and pelvis showing wall thickening along the mid sigmoid colon concerning for infectious versus inflammatory process. Patient also found to have severe hyponatremia.    Assessment/Plan:  Hyponatremia  - Sodium recently was within normal limits. No NSAIDs or nephrotoxic meds used. Did not improve with IV hydration. Possibly SIADH given normal. Placed on fluid restriction without much improvement. ( for past 72 hrs) .Serum osmolality was low. normal urine sodium and urine osmolality .  -Appreciated renal recommendation.  Agree this to be most likely SIADH. Recommend to continue fluid restriction and placed on regular diet. Recommend foley with close urine output monitoring. Pt currently on sodium tablets in addition to fluid restriction and lasix,. Her sodium is improving. Renal consulted and recommendations given.  -hold indapamide.  -TSH and  uric acid normal .  possible abdominal colitis  Patient does have abdominal distention with CT findings of infectious versus inflammation of admission.Marland Kitchen on empiric  Cipro and Flagyl.  no further diarrhea. Vomiting has resolved. Abdominal x-ray repeated on 12/8 shows distended bowel gas.. -Serial abdominal exam.     Hypokalemia  replenish with KCl   Postherpetic neuralgia  Hold off Pain medication at this  time given her confusion.   A. fib  Rate controlled. Coumadin per pharmacy.   Cardiomyopathy  . Hold indapamide and Aldactone for now given hyponatremia. Continue digoxin. Continue atenolol   2-D echo done.  Hypothyroidism.  Continue Synthroid. Normal TSH   Dementia  Recent MMSE of 20   Code Status: DO NOT RESUSCITATE   Family Communication: Spoke with her husband over the phone on 12/8  Disposition Plan: Return to nursing home upon improvement of her sodium and abdominal distention .   Consultants:  Renal    Procedures:  None   Antibiotics:  IV vancomycin, (12 /5- 12/7)  Cipro and Flagyl (12/5--) plan to treat for 7 days  HPI/Subjective:   No overnight issues . Reportedly had BM after enema yesterday     Objective: Filed Vitals:   11/05/13 1327  BP: 151/76  Pulse: 82  Temp: 98 F (36.7 C)  Resp: 16    Intake/Output Summary (Last 24 hours) at 11/05/13 1420 Last data filed at 11/05/13 1300  Gross per 24 hour  Intake    416 ml  Output   1400 ml  Net   -984 ml   Filed Weights   11/03/13 1405 11/03/13 2053 11/04/13 2100  Weight: 91.173 kg (201 lb) 91.173 kg (201 lb) 90.855 kg (200 lb 4.8 oz)    Exam:  General: Elderly female lying in bed in no acute distress  HEENT: No pallor, no icterus, moist oral mucosa  Chest: Clear to auscultation bilaterally, no added sounds  CVS: Normal S1 and S2, no murmurs rub or gallop  Abdomen: moderately distended, tympanic note to percussion, nontender, bowel sounds present  Extremities: trace edema bilaterally,  CNS:  AAO x2, no focal deficit   Data Reviewed: Basic Metabolic Panel:  Recent Labs Lab 11/01/13 0522 11/02/13 0545 11/03/13 0624 11/04/13 0550 11/05/13 0540  NA 121* 121* 121* 121* 125*  K 3.6 3.2* 3.3* 3.4* 3.4*  CL 83* 82* 82* 84* 87*  CO2 23 27 26 26 24   GLUCOSE 115* 124* 106* 100* 98  BUN 10 11 14 14 14   CREATININE 0.54 0.54 0.68 0.64 0.62  CALCIUM 8.7 8.0* 8.0* 7.8* 7.8*   Liver Function  Tests:  Recent Labs Lab 11/01/13 0522  AST 23  ALT 13  ALKPHOS 42  BILITOT 0.7  PROT 6.8  ALBUMIN 3.1*   No results found for this basename: LIPASE, AMYLASE,  in the last 168 hours No results found for this basename: AMMONIA,  in the last 168 hours CBC:  Recent Labs Lab 10/31/13 10/31/13 1845 10/31/13 1900 11/01/13 0522  WBC 5.6 7.5  --  8.0  NEUTROABS  --  5.2  --   --   HGB 11.9* 12.4 13.6 12.6  HCT 34* 35.0* 40.0 35.7*  MCV  --  88.2  --  88.4  PLT 216 222  --  197   Cardiac Enzymes: No results found for this basename: CKTOTAL, CKMB, CKMBINDEX, TROPONINI,  in the last 168 hours BNP (last 3 results)  Recent Labs  10/31/13 1853  PROBNP 4119.0*   CBG: No results found for this basename: GLUCAP,  in the last 168 hours  Recent Results (from the past 240 hour(s))  MRSA PCR SCREENING     Status: Abnormal   Collection Time    11/01/13  5:45 AM      Result Value Range Status   MRSA by PCR POSITIVE (*) NEGATIVE Final   Comment:            The GeneXpert MRSA Assay (FDA     approved for NASAL specimens     only), is one component of a     comprehensive MRSA colonization     surveillance program. It is not     intended to diagnose MRSA     infection nor to guide or     monitor treatment for     MRSA infections.     RESULT CALLED TO, READ BACK BY AND VERIFIED WITH:     A.RIO,RN 3086 11/01/13 M.CAMPBELL     Studies: No results found.  Scheduled Meds: . atenolol  50 mg Oral Daily  . Chlorhexidine Gluconate Cloth  6 each Topical Q0600  . ciprofloxacin  500 mg Oral BID  . digoxin  0.125 mg Oral q morning - 10a  . feeding supplement (ENSURE COMPLETE)  237 mL Oral BID BM  . furosemide  20 mg Oral BID  . heparin  5,000 Units Subcutaneous Q8H  . hydrALAZINE  10 mg Oral Q8H  . levothyroxine  125 mcg Oral QAC breakfast  . metroNIDAZOLE  500 mg Oral Q8H  . mupirocin ointment  1 application Nasal BID  . ondansetron (ZOFRAN) IV  4 mg Intravenous Once  .  polyethylene glycol  17 g Oral Daily  . potassium chloride  40 mEq Oral Once  . sodium chloride  3 mL Intravenous Q12H  . sodium chloride  2 g Oral BID WC  . Warfarin - Pharmacist Dosing Inpatient   Does not apply q1800   Continuous Infusions:     Time spent: 25 minutes    Breck Hollinger  [facility] Pager [phone] If 7PM-7AM, please contact night-coverage at  [email or web address], password [facility] 11/05/2013, 2:20 PM  LOS: 5 days

## 2013-11-05 NOTE — Progress Notes (Signed)
Admit: 10/31/2013 LOS: 5  27F with hypotonic euvolemic hyponatremia, SIADH, insetting of dementia, SNF residence, and admitted w/ fall and ? Colitis.    Subjective:  NAEON Pt not speaking today, opens eyes to voice Sleeping Na up to 125 Started NaCl tabs and lasix yesterday Gabapentin stopped yesterday  Sodium (mmol/L)  Date Value  11/05/2013 125*  11/04/2013 121*  11/03/2013 121*  11/02/2013 121*  11/01/2013 121*  11/01/2013 122*  10/31/2013 124*  10/31/2013 130*  10/27/2013 131*  10/02/2013 133*  10/01/2013 131*  ]  12/09 0701 - 12/10 0700 In: 476 [P.O.:476] Out: 1400 [Urine:1400]  Filed Weights   11/03/13 1405 11/03/13 2053 11/04/13 2100  Weight: 91.173 kg (201 lb) 91.173 kg (201 lb) 90.855 kg (200 lb 4.8 oz)    Current meds: reviewed including gabapent, stopped Current Labs: reviewed   Physical Exam:  Blood pressure 151/76, pulse 82, temperature 98 F (36.7 C), temperature source Oral, resp. rate 16, height 5' 10.5" (1.791 m), weight 90.855 kg (200 lb 4.8 oz), SpO2 95.00%. GEN: frail appearing, lying in bed,  ENT: Has ecchymosis on L forehead  EYES: wearing glasses  CV: IRIR. Normal rate.  PULM: ctab, nl wob  ABD: distended, tender, soft, +BS  SKIN: no rashes  EXT: 2-3+ pitting edema  Assessment/Plan 1. SIADH: Pt with little solute intake.  Levels improved today; Cont fluid restriction, Na Tabs, BID lasix.  No indication for vaptan or 3% saline.  Hold gabapentin as well.    Sabra Heck MD 11/05/2013, 2:07 PM   Recent Labs Lab 11/03/13 0624 11/04/13 0550 11/05/13 0540  NA 121* 121* 125*  K 3.3* 3.4* 3.4*  CL 82* 84* 87*  CO2 26 26 24   GLUCOSE 106* 100* 98  BUN 14 14 14   CREATININE 0.68 0.64 0.62  CALCIUM 8.0* 7.8* 7.8*    Recent Labs Lab 10/31/13 10/31/13 1845 10/31/13 1900 11/01/13 0522  WBC 5.6 7.5  --  8.0  NEUTROABS  --  5.2  --   --   HGB 11.9* 12.4 13.6 12.6  HCT 34* 35.0* 40.0 35.7*  MCV  --  88.2  --  88.4  PLT 216 222  --  197     Current Facility-Administered Medications  Medication Dose Route Frequency Provider Last Rate Last Dose  . acetaminophen (TYLENOL) tablet 650 mg  650 mg Oral Q6H PRN Rolan Lipa, NP   650 mg at 11/05/13 1101  . albuterol (PROVENTIL) (5 MG/ML) 0.5% nebulizer solution 2.5 mg  2.5 mg Nebulization Q4H PRN Rolan Lipa, NP   2.5 mg at 11/01/13 1610  . atenolol (TENORMIN) tablet 50 mg  50 mg Oral Daily Lynden Oxford, MD   50 mg at 11/05/13 1102  . Chlorhexidine Gluconate Cloth 2 % PADS 6 each  6 each Topical Q0600 Nishant Dhungel, MD   6 each at 11/05/13 0704  . ciprofloxacin (CIPRO) tablet 500 mg  500 mg Oral BID Kathlen Mody, MD      . digoxin (LANOXIN) tablet 0.125 mg  0.125 mg Oral q morning - 10a Lynden Oxford, MD   0.125 mg at 11/05/13 1102  . feeding supplement (ENSURE COMPLETE) (ENSURE COMPLETE) liquid 237 mL  237 mL Oral BID BM Heather Cornelison Pitts, RD   237 mL at 11/05/13 1103  . furosemide (LASIX) tablet 20 mg  20 mg Oral BID Arita Miss, MD   20 mg at 11/05/13 1102  . heparin injection 5,000 Units  5,000 Units Subcutaneous Q8H Lynden Oxford,  MD   5,000 Units at 11/05/13 0704  . hydrALAZINE (APRESOLINE) injection 2 mg  2 mg Intravenous Q6H PRN Kathlen Mody, MD      . hydrALAZINE (APRESOLINE) tablet 10 mg  10 mg Oral Q8H Nishant Dhungel, MD   10 mg at 11/05/13 0600  . levothyroxine (SYNTHROID, LEVOTHROID) tablet 125 mcg  125 mcg Oral QAC breakfast Lynden Oxford, MD   125 mcg at 11/05/13 1102  . metroNIDAZOLE (FLAGYL) tablet 500 mg  500 mg Oral Q8H Kathlen Mody, MD      . mupirocin ointment (BACTROBAN) 2 % 1 application  1 application Nasal BID Nishant Dhungel, MD   1 application at 11/05/13 1105  . ondansetron (ZOFRAN) injection 4 mg  4 mg Intravenous Once Vida Roller, MD      . ondansetron [facility]) tablet 4 mg  4 mg Oral Q6H PRN Lynden Oxford, MD       Or  . ondansetron (ZOFRAN) injection 4 mg  4 mg Intravenous Q6H PRN Lynden Oxford, MD      . polyethylene  glycol (MIRALAX / GLYCOLAX) packet 17 g  17 g Oral Daily Nishant Dhungel, MD   17 g at 11/05/13 1103  . potassium chloride SA (K-DUR,KLOR-CON) CR tablet 40 mEq  40 mEq Oral Once Kathlen Mody, MD      . sodium chloride 0.9 % injection 3 mL  3 mL Intravenous Q12H Lynden Oxford, MD   3 mL at 11/05/13 1105  . sodium chloride tablet 2 g  2 g Oral BID WC Arita Miss, MD   2 g at 11/05/13 1101  . Warfarin - Pharmacist Dosing Inpatient   Does not apply [address], [facility]

## 2013-11-05 NOTE — Progress Notes (Addendum)
ANTICOAGULATION CONSULT NOTE - Follow Up Consult  Pharmacy Consult for Coumadin Indication: atrial fibrillation  Allergies  Allergen Reactions  . Erythromycin Rash  . Penicillins Rash  . Tetanus Toxoids Rash    Patient Measurements: Height: 5' 10.5" (179.1 cm) (per patient) Weight: 200 lb 4.8 oz (90.855 kg) IBW/kg (Calculated) : 69.65  Vital Signs: Temp: 98 F (36.7 C) (12/10 1327) Temp src: Oral (12/10 1327) BP: 151/76 mmHg (12/10 1327) Pulse Rate: 82 (12/10 1327)  Labs:  Recent Labs  11/03/13 0610 11/03/13 0624 11/04/13 0550 11/05/13 0540  LABPROT 21.6*  --  20.1* 25.7*  INR 1.94*  --  1.77* 2.44*  CREATININE  --  0.68 0.64 0.62    Estimated Creatinine Clearance: 62.3 ml/min (by C-G formula based on Cr of 0.62).  Assessment:   INR is therapeutic today, for the first time since admitted. Significant increase from 12/9 (1.77->2.44).  Day # 5 Cipro and Flagyl for possible colitis; both changed to PO today. Both can potentially effect Coumadin dosing. Has been on SQ heparin since admitted 12/7.   Home Coumadin regimen: 6 mg alternating with 7 mg.  Goal of Therapy:  INR 2-3 Monitor platelets by anticoagulation protocol: Yes   Plan:   Coumadin 5 mg today.   Discontinue SQ heparin.  Daily PT/INR.  CBC in am.  May need less Coumadin than prior to admission, while on Cipro and Flagyl.  Dennie Fetters, RPh Pager: [phone] 11/05/2013,2:42 PM

## 2013-11-05 NOTE — Progress Notes (Signed)
Physical Therapy Treatment Patient Details Name: Shirley Mccormick MRN: [id number] DOB: Jan 03, 1925 Today's Date: 11/05/2013 Time: 4098-1191 PT Time Calculation (min): 15 min  PT Assessment / Plan / Recommendation  History of Present Illness Shirley Mccormick is a 79 y.o. female with Past medical history of atrial fibrillation, hypothyroidism, neuropathy.  Admitted s/p fall 12/2.She sustained an injury on her for after the fall but as per the nursing documentation she did not have any other injury nor she had any significant change in her mentation. The follow thought to be a mechanical fall. At the time of my evaluation the patient appears poor historian due to her baseline dementia.   PT Comments   Pt with difficulty actively participating with PT today, requires max-total A with mobility and seated balance and max A for grooming tasks.  Will continue to benefit from PT services to increase activity tolerance and decrease burden of care for d/c.  Follow Up Recommendations  SNF     Does the patient have the potential to tolerate intense rehabilitation     Barriers to Discharge        Equipment Recommendations  None recommended by PT    Recommendations for Other Services    Frequency Min 2X/week   Progress towards PT Goals Progress towards PT goals: Progressing toward goals  Plan Current plan remains appropriate    Precautions / Restrictions Precautions Precautions: Fall Restrictions Weight Bearing Restrictions: No   Pertinent Vitals/Pain Pt c/o B LE pain with light touch, RN made aware.    Mobility  Bed Mobility Bed Mobility: Scooting to HOB Supine to Sit: 1: +1 Total assist;HOB elevated Sitting - Scoot to Edge of Bed: 1: +1 Total assist Sit to Supine: 1: +1 Total assist;HOB flat Scooting to HOB: 1: +2 Total assist Scooting to [facility]: Patient Percentage: 0% Details for Bed Mobility Assistance: pt with little active participation in bed mobility, strong posterior lean, 10% assist  with B LEs to assist with out of bed    Exercises     PT Diagnosis:    PT Problem List:   PT Treatment Interventions:     PT Goals (current goals can now be found in the care plan section)    Visit Information  Last PT Received On: 11/05/13 Assistance Needed: +1 (will benefit from +2 for OOB activity) History of Present Illness: Shirley Mccormick is a 79 y.o. female with Past medical history of atrial fibrillation, hypothyroidism, neuropathy.  Admitted s/p fall 12/2.She sustained an injury on her for after the fall but as per the nursing documentation she did not have any other injury nor she had any significant change in her mentation. The follow thought to be a mechanical fall. At the time of my evaluation the patient appears poor historian due to her baseline dementia.    Subjective Data      Cognition  Cognition Arousal/Alertness: Lethargic Behavior During Therapy: Flat affect Overall Cognitive Status: No family/caregiver present to determine baseline cognitive functioning    Balance  Static Sitting Balance Static Sitting - Balance Support: Right upper extremity supported;Feet supported Static Sitting - Level of Assistance: 2: Max assist Static Sitting - Comment/# of Minutes: pt requires max A for sitting balance for 12 minutes edge of bed with attempt to have pt engage in grooming tasks.  Pt combed hair < 30 seconds before fatigue and requires max cuing to complete task.  Pt able to wash face with max cuing and hand over hand assist for initiation.  Limited by fatigue and LE pain  End of Session PT - End of Session Activity Tolerance: Patient limited by fatigue;Patient limited by pain Patient left: in bed;with call bell/phone within reach;with bed alarm set Nurse Communication: Mobility status;Patient requests pain meds   GP     Trinette Vera 11/05/2013, 10:15 AM

## 2013-11-06 ENCOUNTER — Encounter (HOSPITAL_COMMUNITY): Payer: Self-pay | Admitting: General Surgery

## 2013-11-06 ENCOUNTER — Inpatient Hospital Stay (HOSPITAL_COMMUNITY): Payer: Medicare Other

## 2013-11-06 DIAGNOSIS — K6389 Other specified diseases of intestine: Secondary | ICD-10-CM

## 2013-11-06 LAB — BASIC METABOLIC PANEL
BUN: 17 mg/dL (ref 6–23)
CO2: 25 mEq/L (ref 19–32)
Chloride: 92 mEq/L — ABNORMAL LOW (ref 96–112)
Glucose, Bld: 144 mg/dL — ABNORMAL HIGH (ref 70–99)
Potassium: 3.5 mEq/L (ref 3.5–5.1)
Sodium: 128 mEq/L — ABNORMAL LOW (ref 135–145)

## 2013-11-06 LAB — CBC
HCT: 34.4 % — ABNORMAL LOW (ref 36.0–46.0)
Hemoglobin: 11.9 g/dL — ABNORMAL LOW (ref 12.0–15.0)
MCH: 30.4 pg (ref 26.0–34.0)
MCHC: 34.6 g/dL (ref 30.0–36.0)
MCV: 87.8 fL (ref 78.0–100.0)
RBC: 3.92 MIL/uL (ref 3.87–5.11)

## 2013-11-06 LAB — CLOSTRIDIUM DIFFICILE BY PCR: Toxigenic C. Difficile by PCR: NEGATIVE

## 2013-11-06 LAB — PROTIME-INR: Prothrombin Time: 32.3 seconds — ABNORMAL HIGH (ref 11.6–15.2)

## 2013-11-06 MED ORDER — CHLORHEXIDINE GLUCONATE CLOTH 2 % EX PADS: 6.0000 | MEDICATED_PAD | Freq: Every day | CUTANEOUS | Status: DC

## 2013-11-06 MED ORDER — MUPIROCIN 2 % EX OINT
1.0000 "application " | TOPICAL_OINTMENT | Freq: Two times a day (BID) | CUTANEOUS | Status: AC
  Administered 2013-11-06 – 2013-11-10 (×10): 1 via NASAL
  Filled 2013-11-06 (×2): qty 22

## 2013-11-06 NOTE — Progress Notes (Signed)
ANTICOAGULATION CONSULT NOTE - Follow Up Consult  Pharmacy Consult for Coumadin Indication: atrial fibrillation  Allergies  Allergen Reactions  . Erythromycin Rash  . Penicillins Rash  . Tetanus Toxoids Rash    Patient Measurements: Height: 5' 10.5" (179.1 cm) (per patient) Weight: 199 lb 4.8 oz (90.402 kg) IBW/kg (Calculated) : 69.65  Vital Signs: Temp: 98 F (36.7 C) (12/11 0945) Temp src: Oral (12/11 0945) BP: 148/73 mmHg (12/11 0945) Pulse Rate: 88 (12/11 0945)  Labs:  Recent Labs  11/04/13 0550 11/05/13 0540 11/06/13 0526  HGB  --   --  11.9*  HCT  --   --  34.4*  PLT  --   --  254  LABPROT 20.1* 25.7* 32.3*  INR 1.77* 2.44* 3.29*  CREATININE 0.64 0.62 0.67    Estimated Creatinine Clearance: 62.2 ml/min (by C-G formula based on Cr of 0.67).  Assessment:   INR is 3.29 today, supratherapeutic.  Day # 6 Cipro and Flagyl for possible colitis; both changed to PO on 12/10. Both can potentially effect Coumadin dosing.   Home Coumadin regimen: 6 mg alternating with 7 mg.  Goal of Therapy:  INR 2-3 Monitor platelets by anticoagulation protocol: Yes   Plan:   No Coumadin today.   Daily PT/INR.  CBC in am.  Currently needing less Coumadin than prior to admission, likely due to Cipro/Flagyl, and PO intake.  Dennie Fetters, [place] Pager: [phone] 11/06/2013,12:01 PM

## 2013-11-06 NOTE — Progress Notes (Signed)
[facility] PROGRESS NOTE  Shirley Mccormick ZOX:[id number] DOB: 02-Oct-1925 DOA: 10/31/2013 PCP: Gwen Pounds, MD  Brief narrative  79 year old female with history of A. fib on Coumadin, hypothyroidism, peripheral neuropathy, history of recent shingles, who was sent from nursing home with 2 episodes of fall which was thought to be mechanical. She also started having projectile vomiting on the day of admission. Head CT was unremarkable. Initially a cervical collar was applied and he woke on the floor today and a CT of the cervical spine was unremarkable for any acute injury. Patient also found to have abdominal distention with CT scan of the abdomen and pelvis showing wall thickening along the mid sigmoid colon concerning for infectious versus inflammatory process. Patient also found to have severe hyponatremia.    Assessment/Plan:  Hyponatremia  - Sodium recently was within normal limits. No NSAIDs or nephrotoxic meds used. Did not improve with IV hydration. Possibly SIADH. Placed on fluid restriction without much improvement. ( for past 72 hrs) .Serum osmolality was low. normal urine sodium and urine osmolality .  -Appreciated renal recommendation.  Agree this to be most likely SIADH. Recommend to continue fluid restriction and placed on regular diet. Recommend foley with close urine output monitoring. Pt currently on sodium tablets in addition to fluid restriction and lasix,. Her sodium is improving. Renal consulted and recommendations given.  -hold indapamide.  -TSH and  uric acid normal .  possible abdominal colitis  Patient does have abdominal distention with CT findings of infectious versus inflammation of admission.Marland Kitchen on empiric  Cipro and Flagyl.  no further diarrhea. Vomiting has resolved. Abdominal x-ray repeated on 12/8 shows distended bowel gas.. -Serial abdominal exam.     Hypokalemia  replenish with KCl   Postherpetic neuralgia  Hold off Pain medication at this time given her  confusion.   A. fib  Rate controlled. Coumadin per pharmacy.   Cardiomyopathy  . Hold indapamide and Aldactone for now given hyponatremia. Continue digoxin. Continue atenolol   2-D echo done.  Hypothyroidism.  Continue Synthroid. Normal TSH   Dementia  Recent MMSE of 20   Code Status: DO NOT RESUSCITATE   Family Communication: Spoke with her husband over the phone on 12/8  Disposition Plan: Return to nursing home upon improvement of her sodium and abdominal distention .   Consultants:  Renal    Procedures:  None   Antibiotics:  IV vancomycin, (12 /5- 12/7)  Cipro and Flagyl (12/5--) plan to treat for 7 days  HPI/Subjective:   No overnight issues . Reportedly had BM after enema yesterday     Objective: Filed Vitals:   11/06/13 0945  BP: 148/73  Pulse: 88  Temp: 98 F (36.7 C)  Resp: 18    Intake/Output Summary (Last 24 hours) at 11/06/13 1208 Last data filed at 11/06/13 0900  Gross per 24 hour  Intake    400 ml  Output    775 ml  Net   -375 ml   Filed Weights   11/03/13 2053 11/04/13 2100 11/05/13 2127  Weight: 91.173 kg (201 lb) 90.855 kg (200 lb 4.8 oz) 90.402 kg (199 lb 4.8 oz)    Exam:  General: Elderly female lying in bed in no acute distress  HEENT: No pallor, no icterus, moist oral mucosa  Chest: Clear to auscultation bilaterally, no added sounds  CVS: Normal S1 and S2, no murmurs rub or gallop  Abdomen: moderately distended, tympanic note to percussion, nontender, bowel sounds present  Extremities: trace edema bilaterally,  CNS: AAO x2, no focal deficit   Data Reviewed: Basic Metabolic Panel:  Recent Labs Lab 11/02/13 0545 11/03/13 0624 11/04/13 0550 11/05/13 0540 11/06/13 0526  NA 121* 121* 121* 125* 128*  K 3.2* 3.3* 3.4* 3.4* 3.5  CL 82* 82* 84* 87* 92*  CO2 27 26 26 24 25   GLUCOSE 124* 106* 100* 98 144*  BUN 11 14 14 14 17   CREATININE 0.54 0.68 0.64 0.62 0.67  CALCIUM 8.0* 8.0* 7.8* 7.8* 8.1*   Liver Function  Tests:  Recent Labs Lab 11/01/13 0522  AST 23  ALT 13  ALKPHOS 42  BILITOT 0.7  PROT 6.8  ALBUMIN 3.1*   No results found for this basename: LIPASE, AMYLASE,  in the last 168 hours No results found for this basename: AMMONIA,  in the last 168 hours CBC:  Recent Labs Lab 10/31/13 10/31/13 1845 10/31/13 1900 11/01/13 0522 11/06/13 0526  WBC 5.6 7.5  --  8.0 9.8  NEUTROABS  --  5.2  --   --   --   HGB 11.9* 12.4 13.6 12.6 11.9*  HCT 34* 35.0* 40.0 35.7* 34.4*  MCV  --  88.2  --  88.4 87.8  PLT 216 222  --  197 254   Cardiac Enzymes: No results found for this basename: CKTOTAL, CKMB, CKMBINDEX, TROPONINI,  in the last 168 hours BNP (last 3 results)  Recent Labs  10/31/13 1853  PROBNP 4119.0*   CBG:  Recent Labs Lab 11/05/13 2126  GLUCAP 106*    Recent Results (from the past 240 hour(s))  MRSA PCR SCREENING     Status: Abnormal   Collection Time    11/01/13  5:45 AM      Result Value Range Status   MRSA by PCR POSITIVE (*) NEGATIVE Final   Comment:            The GeneXpert MRSA Assay (FDA     approved for NASAL specimens     only), is one component of a     comprehensive MRSA colonization     surveillance program. It is not     intended to diagnose MRSA     infection nor to guide or     monitor treatment for     MRSA infections.     RESULT CALLED TO, READ BACK BY AND VERIFIED WITH:     A.RIO,RN 1610 11/01/13 M.CAMPBELL  CLOSTRIDIUM DIFFICILE BY PCR     Status: None   Collection Time    11/06/13  1:17 AM      Result Value Range Status   C difficile by pcr NEGATIVE  NEGATIVE Final     Studies: No results found.  Scheduled Meds: . atenolol  50 mg Oral Daily  . ciprofloxacin  500 mg Oral BID  . digoxin  0.125 mg Oral q morning - 10a  . feeding supplement (ENSURE COMPLETE)  237 mL Oral BID BM  . furosemide  20 mg Oral BID  . hydrALAZINE  10 mg Oral Q8H  . levothyroxine  125 mcg Oral QAC breakfast  . metroNIDAZOLE  500 mg Oral Q8H  . ondansetron  (ZOFRAN) IV  4 mg Intravenous Once  . polyethylene glycol  17 g Oral Daily  . sodium chloride  3 mL Intravenous Q12H  . sodium chloride  2 g Oral BID WC  . Warfarin - Pharmacist Dosing Inpatient   Does not apply q1800   Continuous Infusions:     Time spent: 25 minutes  [facility]  [facility] Pager [phone] If 7PM-7AM, please contact night-coverage at [email or web address], password [facility] 11/06/2013, 12:08 PM  LOS: 6 days

## 2013-11-06 NOTE — Progress Notes (Signed)
Admit: 10/31/2013 LOS: 6  7F with hypotonic euvolemic hyponatremia, SIADH, insetting of dementia, SNF residence, and admitted w/ fall and ? Colitis.    Subjective:  NAEON Answers only the most basic questions Na increasing Repeat UOsms no different   Sodium (mmol/L)  Date Value  11/06/2013 128*  11/05/2013 125*  11/04/2013 121*  11/03/2013 121*  11/02/2013 121*  11/01/2013 121*  11/01/2013 122*  10/31/2013 124*  10/31/2013 130*  10/27/2013 131*  10/02/2013 133*  10/01/2013 131*  ]  12/10 0701 - 12/11 0700 In: 400 [P.O.:400] Out: 775 [Urine:775]  Filed Weights   11/03/13 2053 11/04/13 2100 11/05/13 2127  Weight: 91.173 kg (201 lb) 90.855 kg (200 lb 4.8 oz) 90.402 kg (199 lb 4.8 oz)    Current meds: reviewed including gabapentin, stopped Current Labs: reviewed   Physical Exam:  Blood pressure 148/73, pulse 88, temperature 98 F (36.7 C), temperature source Oral, resp. rate 18, height 5' 10.5" (1.791 m), weight 90.402 kg (199 lb 4.8 oz), SpO2 98.00%. GEN: frail appearing, lying in bed,  ENT: Has ecchymosis on L forehead  EYES: wearing glasses  CV: IRIR. Normal rate.  PULM: ctab, nl wob  ABD: distended, tender, soft, +BS  SKIN: no rashes  EXT: 2-3+ pitting edema  Assessment/Plan 1. SIADH: Pt with little solute intake. Na further improving.  No change to current plan [fluid restriction, NaCl tabs, low dose lasix] and this could be continued at SNF upon d/c.    Sabra Heck MD 11/06/2013, 10:18 AM   Recent Labs Lab 11/04/13 0550 11/05/13 0540 11/06/13 0526  NA 121* 125* 128*  K 3.4* 3.4* 3.5  CL 84* 87* 92*  CO2 26 24 25   GLUCOSE 100* 98 144*  BUN 14 14 17   CREATININE 0.64 0.62 0.67  CALCIUM 7.8* 7.8* 8.1*    Recent Labs Lab 10/31/13 1845 10/31/13 1900 11/01/13 0522 11/06/13 0526  WBC 7.5  --  8.0 9.8  NEUTROABS 5.2  --   --   --   HGB 12.4 13.6 12.6 11.9*  HCT 35.0* 40.0 35.7* 34.4*  MCV 88.2  --  88.4 87.8  PLT 222  --  197 254    Current  Facility-Administered Medications  Medication Dose Route Frequency Provider Last Rate Last Dose  . acetaminophen (TYLENOL) tablet 650 mg  650 mg Oral Q6H PRN Rolan Lipa, NP   650 mg at 11/05/13 1101  . albuterol (PROVENTIL) (5 MG/ML) 0.5% nebulizer solution 2.5 mg  2.5 mg Nebulization Q4H PRN Rolan Lipa, NP   2.5 mg at 11/01/13 0981  . atenolol (TENORMIN) tablet 50 mg  50 mg Oral Daily Lynden Oxford, MD   50 mg at 11/05/13 1102  . ciprofloxacin (CIPRO) tablet 500 mg  500 mg Oral BID Kathlen Mody, MD   500 mg at 11/06/13 0828  . digoxin (LANOXIN) tablet 0.125 mg  0.125 mg Oral q morning - 10a Lynden Oxford, MD   0.125 mg at 11/05/13 1102  . feeding supplement (ENSURE COMPLETE) (ENSURE COMPLETE) liquid 237 mL  237 mL Oral BID BM Heather Cornelison Pitts, RD   237 mL at 11/05/13 1453  . furosemide (LASIX) tablet 20 mg  20 mg Oral BID Arita Miss, MD   20 mg at 11/05/13 1809  . hydrALAZINE (APRESOLINE) injection 2 mg  2 mg Intravenous Q6H PRN Kathlen Mody, MD      . hydrALAZINE (APRESOLINE) tablet 10 mg  10 mg Oral Q8H Nishant Dhungel, MD   10 mg  at 11/06/13 0608  . levothyroxine (SYNTHROID, LEVOTHROID) tablet 125 mcg  125 mcg Oral QAC breakfast Lynden Oxford, MD   125 mcg at 11/05/13 1102  . metroNIDAZOLE (FLAGYL) tablet 500 mg  500 mg Oral Q8H Kathlen Mody, MD   500 mg at 11/06/13 0609  . ondansetron (ZOFRAN) injection 4 mg  4 mg Intravenous Once Vida Roller, MD      . ondansetron [facility]) tablet 4 mg  4 mg Oral Q6H PRN Lynden Oxford, MD       Or  . ondansetron (ZOFRAN) injection 4 mg  4 mg Intravenous Q6H PRN Lynden Oxford, MD      . polyethylene glycol (MIRALAX / GLYCOLAX) packet 17 g  17 g Oral Daily Nishant Dhungel, MD   17 g at 11/05/13 1103  . sodium chloride 0.9 % injection 3 mL  3 mL Intravenous Q12H Lynden Oxford, MD   3 mL at 11/05/13 2225  . sodium chloride tablet 2 g  2 g Oral BID WC Arita Miss, MD   2 g at 11/06/13 1610  . Warfarin - Pharmacist Dosing  Inpatient   Does not apply [address], [facility]

## 2013-11-06 NOTE — Consult Note (Signed)
Shirley Mccormick [birth year]  [id number].   Requesting MD: Dr. Blake Divine Chief Complaint/Reason for Consult: large bowel obstruction HPI: This is an 79yo white female who has some baseline dementia and is difficult to get a history from.  She has no idea why she is in the hospital.  She is unaware that she has any issue with her colon.  She does admit to falling and hitting her head.  She states she is eating a solid diet without nausea or vomiting.  She is having BMs.  She denies any abdominal pain.  Apparently, when the patient was admitted though, she was having some nausea and emesis.  She did have a CT scan that revealed mild thickening of the mid sigmoid colon.  She was started on IV Cipro and Flagyl.  She has then had some mild colon distention.  We were asked today to evaluate this patient for further recommendations.  ROS: Please see HPI.  A full review of systems is difficult to obtain due to her dementia.   History reviewed. No pertinent family history.  Past Medical History  Diagnosis Date  . Afib   . Shingles 10/01/13  . Pulmonary embolism 1990  . Guillain-Barre 1990    resolved  . Unspecified hypothyroidism   . Neuropathy     Past Surgical History  Procedure Laterality Date  . Breast lumpectomy Right 1965    Dr. Judithe Modest    Social History:  reports that she has never smoked. She does not have any smokeless tobacco history on file. She reports that she does not drink alcohol or use illicit drugs.  Allergies:  Allergies  Allergen Reactions  . Erythromycin Rash  . Penicillins Rash  . Tetanus Toxoids Rash    Medications Prior to Admission  Medication Sig Dispense Refill  . acetaminophen (TYLENOL) 325 MG tablet Take 650 mg by mouth every 6 (six) hours as needed for mild pain.      Marland Kitchen atenolol (TENORMIN) 50 MG tablet Take 50 mg by mouth daily.      . cholecalciferol (VITAMIN D) 1000 UNITS tablet Take 1,000 Units by mouth every morning.       . digoxin (LANOXIN) 0.125  MG tablet Take 0.125 mg by mouth every morning.       . diphenhydramine-acetaminophen (TYLENOL PM) 25-500 MG TABS Take 2 tablets by mouth at bedtime.      . fish oil-omega-3 fatty acids 1000 MG capsule Take 1 g by mouth 2 (two) times daily.       Marland Kitchen gabapentin (NEURONTIN) 100 MG capsule Take 100 mg by mouth 3 (three) times daily.      Marland Kitchen glucosamine-chondroitin 500-400 MG tablet Take 1 tablet by mouth 3 (three) times daily.      Marland Kitchen HYDROcodone-acetaminophen (NORCO/VICODIN) 5-325 MG per tablet Take 1 tablet by mouth every 6 (six) hours.      . hydrOXYzine (ATARAX/VISTARIL) 25 MG tablet Take 25 mg by mouth every 8 (eight) hours as needed for itching.      . indapamide (LOZOL) 2.5 MG tablet Take 2.5 mg by mouth every morning.      Marland Kitchen ipratropium-albuterol (DUONEB) 0.5-2.5 (3) MG/3ML SOLN Take 3 mLs by nebulization 3 (three) times daily. For 3 days only. START 12.3.14 END 12.5.14      . levothyroxine (SYNTHROID, LEVOTHROID) 125 MCG tablet Take 125 mcg by mouth daily.      . Multiple Vitamins-Minerals (CERTAVITE/ANTIOXIDANTS PO) Take 1 tablet by mouth every morning.      Marland Kitchen  potassium chloride (K-DUR) 10 MEQ tablet Take 10 mEq by mouth 2 (two) times daily.      Marland Kitchen spironolactone (ALDACTONE) 25 MG tablet Take 25 mg by mouth every morning.       . warfarin (COUMADIN) 1 MG tablet Take 7 mg by mouth daily at 6 PM.      . warfarin (COUMADIN) 6 MG tablet Take 6 mg by mouth daily at 6 PM.        Blood pressure 138/71, pulse 82, temperature 98 F (36.7 C), temperature source Oral, resp. rate 18, height 5' 10.5" (1.791 m), weight 199 lb 4.8 oz (90.402 kg), SpO2 98.00%. Physical Exam: General: pleasant,obese white female who is laying in bed in NAD HEENT: head is normocephalic, she has a resolving ecchymosis on the left side of her forehead.  Sclera are noninjected.  PERRL.  Ears and nose without any masses or lesions.  Mouth is pink and moist Heart: regular, rate, and rhythm.  Normal s1,s2. No obvious murmurs,  gallops, or rubs noted.  Palpable radial pulses bilaterally Lungs: CTAB, no wheezes, rhonchi, or rales noted.  Respiratory effort however appears mildly labored with some accessory muscle use Abd: soft, NT, some distention with tympany, +BS, no masses, hernias, or organomegaly MS: all 4 extremities are symmetrical with no cyanosis, clubbing.  She does have lower extremity edema Skin: warm and dry with no masses, lesions, or rashes Psych: A&Ox1.  She was oriented to place only with multiple choice options.  She was not oriented to time at all.  She is slow to answer questions and sometimes just stares as opposed to answering    Results for orders placed during the hospital encounter of 10/31/13 (from the past 48 hour(s))  PROTIME-INR     Status: Abnormal   Collection Time    11/05/13  5:40 AM      Result Value Range   Prothrombin Time 25.7 (*) 11.6 - 15.2 seconds   INR 2.44 (*) 0.00 - 1.49  BASIC METABOLIC PANEL     Status: Abnormal   Collection Time    11/05/13  5:40 AM      Result Value Range   Sodium 125 (*) 135 - 145 mEq/L   Potassium 3.4 (*) 3.5 - 5.1 mEq/L   Chloride 87 (*) 96 - 112 mEq/L   CO2 24  19 - 32 mEq/L   Glucose, Bld 98  70 - 99 mg/dL   BUN 14  6 - 23 mg/dL   Creatinine, Ser 1.61  0.50 - 1.10 mg/dL   Calcium 7.8 (*) 8.4 - 10.5 mg/dL   GFR calc non Af Amer 79 (*) >90 mL/min   GFR calc Af Amer >90  >90 mL/min   Comment: (NOTE)     The eGFR has been calculated using the CKD EPI equation.     This calculation has not been validated in all clinical situations.     eGFR's persistently <90 mL/min signify possible Chronic Kidney     Disease.  OSMOLALITY, URINE     Status: None   Collection Time    11/05/13  5:20 PM      Result Value Range   Osmolality, Ur 471  390 - 1090 mOsm/kg   Comment: Performed at [facility]  GLUCOSE, CAPILLARY     Status: Abnormal   Collection Time    11/05/13  9:26 PM      Result Value Range   Glucose-Capillary 106 (*) 70 - 99  mg/dL  CLOSTRIDIUM DIFFICILE BY PCR     Status: None   Collection Time    11/06/13  1:17 AM      Result Value Range   C difficile by pcr NEGATIVE  NEGATIVE  PROTIME-INR     Status: Abnormal   Collection Time    11/06/13  5:26 AM      Result Value Range   Prothrombin Time 32.3 (*) 11.6 - 15.2 seconds   INR 3.29 (*) 0.00 - 1.49  BASIC METABOLIC PANEL     Status: Abnormal   Collection Time    11/06/13  5:26 AM      Result Value Range   Sodium 128 (*) 135 - 145 mEq/L   Potassium 3.5  3.5 - 5.1 mEq/L   Chloride 92 (*) 96 - 112 mEq/L   CO2 25  19 - 32 mEq/L   Glucose, Bld 144 (*) 70 - 99 mg/dL   BUN 17  6 - 23 mg/dL   Creatinine, Ser 4.54  0.50 - 1.10 mg/dL   Calcium 8.1 (*) 8.4 - 10.5 mg/dL   GFR calc non Af Amer 77 (*) >90 mL/min   GFR calc Af Amer 90 (*) >90 mL/min   Comment: (NOTE)     The eGFR has been calculated using the CKD EPI equation.     This calculation has not been validated in all clinical situations.     eGFR's persistently <90 mL/min signify possible Chronic Kidney     Disease.  CBC     Status: Abnormal   Collection Time    11/06/13  5:26 AM      Result Value Range   WBC 9.8  4.0 - 10.5 K/uL   RBC 3.92  3.87 - 5.11 MIL/uL   Hemoglobin 11.9 (*) 12.0 - 15.0 g/dL   HCT 09.8 (*) 11.9 - 14.7 %   MCV 87.8  78.0 - 100.0 fL   MCH 30.4  26.0 - 34.0 pg   MCHC 34.6  30.0 - 36.0 g/dL   RDW 82.9  56.2 - 13.0 %   Platelets 254  150 - 400 K/uL   Dg Abd 2 Views  11/06/2013   CLINICAL DATA:  Distended abdomen.  EXAM: ABDOMEN - 2 VIEW  COMPARISON:  11/03/2013.  FINDINGS: Gas distended colon measuring up to 9.9 cm. This may represent an ileus related to inflammation although distal obstructing lesion not excluded. No free intraperitoneal air.  IMPRESSION: Gas distended colon measuring up to 9.9 cm. This may represent an ileus related to inflammation although distal obstructing lesion not excluded. No free intraperitoneal air.   Electronically Signed   By: Bridgett Larsson M.D.    On: 11/06/2013 14:07       Assessment/Plan 1. Colonic distention, possible colonic ileus Patient Active Problem List   Diagnosis Date Noted  . Noninfectious gastroenteritis and colitis 11/05/2013  . Accelerated hypertension 11/05/2013  . CHF (congestive heart failure) 11/02/2013  . Hypokalemia 11/02/2013  . Cellulitis 11/01/2013  . Other and unspecified noninfectious gastroenteritis and colitis(558.9) 11/01/2013  . Contusion of scalp 10/31/2013  . Fall at nursing home 10/31/2013  . Anemia, chronic disease 10/31/2013  . Projectile vomiting 10/31/2013  . Edema 10/28/2013  . Wheezes 10/28/2013  . Impacted cerumen of both ears 10/28/2013  . Rectal bleeding 10/28/2013  . Memory impairment 10/17/2013  . Postherpetic neuralgia 10/10/2013  . Hyponatremia 10/03/2013  . A-fib 10/03/2013  . Anticoagulation goal of INR 2 to 3 10/03/2013  . Unspecified hypothyroidism 10/03/2013  .  Peripheral neuropathy 10/03/2013  . Abnormality of gait 10/03/2013  . Urine incontinence 10/03/2013  . Herpes zoster 10/02/2013  . Lower extremity weakness 10/01/2013  . Muscular deconditioning 10/01/2013   Plan: 1. The patient does not have evidence of a small bowel obstruction.  She is tolerating a regular diet and having BMs.  Her K is on the low end of normal, but keeping this normal will help keep her bowel and colon functioning. 2. Mobilizing the patient as much as possible will also help with colonic motility. 3.  The patient had mild thickening on her CT scan from 6 days ago.  She has had 6 days of IV abx therapy.  Do not suspect she has any worsening of this as she has no abdominal pain. 4. There are no acute surgical indications.  If the patient does worsens you may want to consider a GI consult, otherwise cont current care.  We will sign off.  Please call with any other questions or concerns.  Jayvien Rowlette E 11/06/2013, 3:26 PM Pager: [phone]

## 2013-11-06 NOTE — Consult Note (Signed)
General Surgery [facility]  Patient seen and examined.  Discussed with Barnetta Chapel.  I have reviewed CT scan from last week and plain AXR's.  Abdominal exam shows distended abdomen which is tympanitic.  No tenderness.  No mass.  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  Usual treatment with supportive measures including hydration, correcting electrolyte abnormalities, limiting narcotics, mobilization.  May need to ask gastroenterology to evaluate - ? Use of prokinetic agents.  If persistent concerns over obstruction, consider gastrograffin enema which may be both diagnostic and therapeutic.  Call if needed.  Will re-evaluate if necessary.  Velora Heckler, MD, [facility] Office: [phone]

## 2013-11-07 ENCOUNTER — Inpatient Hospital Stay (HOSPITAL_COMMUNITY): Payer: Medicare Other

## 2013-11-07 DIAGNOSIS — K6389 Other specified diseases of intestine: Secondary | ICD-10-CM | POA: Diagnosis not present

## 2013-11-07 LAB — BASIC METABOLIC PANEL
BUN: 17 mg/dL (ref 6–23)
Calcium: 8.3 mg/dL — ABNORMAL LOW (ref 8.4–10.5)
GFR calc Af Amer: >90 mL/min (ref >90)
GFR calc non Af Amer: 80 mL/min — ABNORMAL LOW (ref >90)
Glucose, Bld: 126 mg/dL — ABNORMAL HIGH (ref 70–99)
Sodium: 132 mEq/L — ABNORMAL LOW (ref 135–145)

## 2013-11-07 LAB — PROTIME-INR: Prothrombin Time: 33.8 seconds — ABNORMAL HIGH (ref 11.6–15.2)

## 2013-11-07 MED ORDER — POTASSIUM CHLORIDE CRYS ER 20 MEQ PO TBCR
40.0000 meq | EXTENDED_RELEASE_TABLET | Freq: Two times a day (BID) | ORAL | Status: AC
  Administered 2013-11-07 (×2): 40 meq via ORAL
  Filled 2013-11-07 (×2): qty 2

## 2013-11-07 MED ORDER — AMLODIPINE BESYLATE 5 MG PO TABS
5.0000 mg | ORAL_TABLET | Freq: Every day | ORAL | Status: DC
  Administered 2013-11-07 – 2013-11-11 (×5): 5 mg via ORAL
  Filled 2013-11-07 (×5): qty 1

## 2013-11-07 MED ORDER — METOCLOPRAMIDE HCL 5 MG PO TABS
5.0000 mg | ORAL_TABLET | Freq: Three times a day (TID) | ORAL | Status: DC
  Administered 2013-11-07 – 2013-11-11 (×14): 5 mg via ORAL
  Filled 2013-11-07 (×18): qty 1

## 2013-11-07 NOTE — Progress Notes (Signed)
ANTICOAGULATION CONSULT NOTE - Follow Up Consult  Pharmacy Consult for Coumadin Indication: atrial fibrillation  Allergies  Allergen Reactions  . Erythromycin Rash  . Penicillins Rash  . Tetanus Toxoids Rash    Patient Measurements: Height: 5' 10.5" (179.1 cm) (per patient) Weight: 199 lb 4.8 oz (90.402 kg) IBW/kg (Calculated) : 69.65  Vital Signs: Temp: 98.4 F (36.9 C) (12/12 1000) Temp src: Oral (12/12 1000) BP: 185/81 mmHg (12/12 1000) Pulse Rate: 90 (12/12 1000)  Labs:  Recent Labs  11/05/13 0540 11/06/13 0526 11/07/13 0426  HGB  --  11.9*  --   HCT  --  34.4*  --   PLT  --  254  --   LABPROT 25.7* 32.3* 33.8*  INR 2.44* 3.29* 3.50*  CREATININE 0.62 0.67 0.60    Estimated Creatinine Clearance: 62.2 ml/min (by C-G formula based on Cr of 0.6).  Assessment: 78 yo F from SNF, s/p fall with c/o projectile emesis and AMS, found w/ hyponatremia and bruise on head.   CT showed concerns for inflammatory or infectious process within colon, possibly colonic ileus.  Patient is on day#7 Cipro and Flagyl for possible colitis (both can potentially increase effects of warfarin).  INR has continued to rise to 3.5 despite held dose last night, likely d/t above mentioned interaction and decreased oral intake.  PTA Coumadin regimen: 6 mg alternating with 7 mg  Goal of Therapy:  INR 2-3 Monitor platelets by anticoagulation protocol: Yes   Plan:  - continue to hold Coumadin tonight - daily PT/INR - CBC tomorrow AM - monitor for s/s of bleeding  Harrold Donath E. Achilles Dunk, PharmD Clinical Pharmacist - Resident Pager: [phone] Pharmacy: [phone] 11/07/2013 10:25 AM

## 2013-11-07 NOTE — Progress Notes (Signed)
Physical Therapy Treatment Patient Details Name: Shirley Mccormick MRN: [id number] DOB: 03-19-1925 Today's Date: 11/07/2013 Time: 8469-6295 PT Time Calculation (min): 29 min  PT Assessment / Plan / Recommendation  History of Present Illness Shirley Mccormick is a 79 y.o. female with Past medical history of atrial fibrillation, hypothyroidism, neuropathy.  Admitted s/p fall 12/2.She sustained an injury on her for after the fall but as per the nursing documentation she did not have any other injury nor she had any significant change in her mentation. The follow thought to be a mechanical fall. At the time of my evaluation the patient appears poor historian due to her baseline dementia.   PT Comments   Pt with minimal participation and requires extensive A just to come to sit at EOB.  Pt resists mobility at times and tries to push herself back to down supine.  Will continue to follow.    Follow Up Recommendations  SNF     Does the patient have the potential to tolerate intense rehabilitation     Barriers to Discharge        Equipment Recommendations  None recommended by PT    Recommendations for Other Services    Frequency Min 2X/week   Progress towards PT Goals Progress towards PT goals: Progressing toward goals  Plan Current plan remains appropriate    Precautions / Restrictions Precautions Precautions: Fall Restrictions Weight Bearing Restrictions: No   Pertinent Vitals/Pain Indicates R hip pain.  RN made aware.      Mobility  Bed Mobility Bed Mobility: Supine to Sit;Sitting - Scoot to [place] of Bed;Sit to Supine;Scooting to [facility] Supine to Sit: 1: +2 Total assist;HOB elevated Supine to Sit: Patient Percentage: 10% Sitting - Scoot to Edge of Bed: 1: +2 Total assist Sitting - Scoot to Edge of Bed: Patient Percentage: 0% Sit to Supine: 1: +2 Total assist Sit to Supine: Patient Percentage: 0% Scooting to HOB: 1: +2 Total assist Scooting to [facility]: Patient Percentage: 0% Details  for Bed Mobility Assistance: pt with minimal active participation.  With coming to sit pt did move L UE towards bed rail.  Max cueing for pt to attempt to participate, however at times she just reaches to hold on to staf and needs firm cueing to use UEs to A and not grab staff.   Transfers Transfers: Not assessed Ambulation/Gait Ambulation/Gait Assistance: Not tested (comment) Stairs: No Wheelchair Mobility Wheelchair Mobility: No    Exercises     PT Diagnosis:    PT Problem List:   PT Treatment Interventions:     PT Goals (current goals can now be found in the care plan section) Acute Rehab PT Goals Patient Stated Goal: none stated Time For Goal Achievement: 11/17/13 Potential to Achieve Goals: Fair  Visit Information  Last PT Received On: 11/07/13 Assistance Needed: +2 History of Present Illness: Shirley Mccormick is a 79 y.o. female with Past medical history of atrial fibrillation, hypothyroidism, neuropathy.  Admitted s/p fall 12/2.She sustained an injury on her for after the fall but as per the nursing documentation she did not have any other injury nor she had any significant change in her mentation. The follow thought to be a mechanical fall. At the time of my evaluation the patient appears poor historian due to her baseline dementia.    Subjective Data  Patient Stated Goal: none stated   Cognition  Cognition Arousal/Alertness:  (Drowsy, but arousable) Behavior During Therapy: Flat affect Overall Cognitive Status: No family/caregiver present to determine  baseline cognitive functioning Memory: Decreased short-term memory    Balance  Balance Balance Assessed: Yes Static Sitting Balance Static Sitting - Balance Support: Right upper extremity supported;Feet supported Static Sitting - Level of Assistance: 2: Max assist Static Sitting - Comment/# of Minutes: pt leans L and posteriorly, needing strong cueing and A to correct balance.  pt initially attempts to correct balance  when cued, but unable to maintain.  Attempted to have pt reach anteriorly to A with wt shifting and balance, however pt not attempting to lift UEs without physical A.    End of Session PT - End of Session Activity Tolerance: Patient limited by fatigue Patient left: in bed;with call bell/phone within reach;with bed alarm set Nurse Communication: Mobility status;Need for lift equipment   GP     Sunny Schlein,  [phone] 11/07/2013, 3:03 PM

## 2013-11-07 NOTE — Clinical Social Work Psychosocial (Addendum)
Clinical Social Work Department BRIEF PSYCHOSOCIAL ASSESSMENT 11/07/2013  Patient:  Shirley Mccormick, Shirley Mccormick     Account Number:  [id number]     Admit date:  10/31/2013  Clinical Social Worker:  Delmer Islam  Date/Time:  11/07/2013 10:45 AM  Referred by:  Physician  Date Referred:  11/03/2013 Referred for  SNF Placement   Other Referral:   Interview type:  Other - See comment Other interview type:   SW - [name]    PSYCHOSOCIAL DATA Living Status:  FACILITY Admitted from facility:  [facility] Level of care:  Skilled Nursing Facility Primary support name:  Shirley Mccormick Primary support relationship to patient:  SPOUSE Degree of support available:   Patient and husband live together at [place] in ALF.    CURRENT CONCERNS Current Concerns  Post-Acute Placement   Other Concerns:    SOCIAL WORK ASSESSMENT / PLAN On 11/03/13 CSW talked by phone with Genia Harold, SW with [facility] regarding patient. CSW informed by SW that patient and husband live at [facility] in Independent Living, however after patient developed Shingles she came to [facility]. Per SW, patient has a bed in Healthcare and the plan is for her to return there at discharge.   Assessment/plan status:  Psychosocial Support/Ongoing Assessment of Needs Other assessment/ plan:   Information/referral to community resources:   None needed or requested at this time.    PATIENT'S/FAMILY'S RESPONSE TO PLAN OF CARE: Per [facility] SW on 12/8, the plan is for patient to return to the healthcare facility. CSW received VM from SW Genia Harold later same date requesting that MD contact husband, as he wants to be updated on patient's medical status.

## 2013-11-07 NOTE — Progress Notes (Signed)
[facility] PROGRESS NOTE  [name] ZOX:[id number] DOB: 01-03-1925 DOA: 10/31/2013 PCP: Gwen Pounds, MD  Brief narrative  79 year old female with history of A. fib on Coumadin, hypothyroidism, peripheral neuropathy, history of recent shingles, who was sent from nursing home with 2 episodes of fall which was thought to be mechanical. She also started having projectile vomiting on the day of admission. Head CT was unremarkable. Initially a cervical collar was applied and he woke on the floor today and a CT of the cervical spine was unremarkable for any acute injury. Patient also found to have abdominal distention with CT scan of the abdomen and pelvis showing wall thickening along the mid sigmoid colon concerning for infectious versus inflammatory process. Patient also found to have severe hyponatremia.    Assessment/Plan:  Hyponatremia  - Sodium recently was within normal limits. No NSAIDs or nephrotoxic meds used. Did not improve with IV hydration. Possibly SIADH. Placed on fluid restriction without much improvement. ( for past 72 hrs) .Serum osmolality was low. normal urine sodium and urine osmolality .  -Appreciated renal recommendation.  Agree this to be most likely SIADH. Recommend to continue fluid restriction and placed on regular diet. Recommend foley with close urine output monitoring. Pt currently on sodium tablets in addition to fluid restriction and lasix,. Her sodium is improving. Renal consulted and recommendations given.  -hold indapamide.  -TSH and  uric acid normal .  possible abdominal colitis  Patient does have abdominal distention with CT findings of infectious versus inflammation of admission.Marland Kitchen on empiric  Cipro and Flagyl.  no further diarrhea. Vomiting has resolved. Abdominal x-ray repeated on 12/8 shows distended bowel gas possibly ileus,. No obstruction seen. Surgery consulted recommended ambulation, replete K and prokinetic agents. -Serial abdominal films,  with clinical indications.      Hypokalemia  replenish with KCl and repeat in am.   Postherpetic neuralgia  Hold off Pain medication at this time given her confusion.   A. fib  Rate controlled. Coumadin per pharmacy.   Cardiomyopathy  . Hold indapamide and Aldactone for now given hyponatremia. Continue digoxin. Continue atenolol   2-D echo done.  Hypothyroidism.  Continue Synthroid. Normal TSH   Dementia  Recent MMSE of 20   Code Status: DO NOT RESUSCITATE   Family Communication: Spoke with her husband over the phone on 12/8  Disposition Plan: Return to nursing home upon improvement of her sodium and abdominal distention .   Consultants:  Renal    Procedures:  None   Antibiotics:  IV vancomycin, (12 /5- 12/7)  Cipro and Flagyl (12/5--) plan to treat for 7 days  HPI/Subjective:   No overnight issues . Reportedly had BM after enema yesterday     Objective: Filed Vitals:   11/07/13 1400  BP: 147/76  Pulse: 86  Temp: 98 F (36.7 C)  Resp: 18    Intake/Output Summary (Last 24 hours) at 11/07/13 1534 Last data filed at 11/07/13 1300  Gross per 24 hour  Intake    300 ml  Output   1675 ml  Net  -1375 ml   Filed Weights   11/04/13 2100 11/05/13 2127 11/06/13 2042  Weight: 90.855 kg (200 lb 4.8 oz) 90.402 kg (199 lb 4.8 oz) 90.402 kg (199 lb 4.8 oz)    Exam:  General: Elderly female lying in bed in no acute distress  HEENT: No pallor, no icterus, moist oral mucosa  Chest: Clear to auscultation bilaterally, no added sounds  CVS: Normal S1 and S2, no  murmurs rub or gallop  Abdomen: moderately distended, tympanic note to percussion, nontender, bowel sounds present  Extremities: trace edema bilaterally,  CNS: AAO x2, no focal deficit   Data Reviewed: Basic Metabolic Panel:  Recent Labs Lab 11/03/13 0624 11/04/13 0550 11/05/13 0540 11/06/13 0526 11/07/13 0426  NA 121* 121* 125* 128* 132*  K 3.3* 3.4* 3.4* 3.5 3.3*  CL 82* 84* 87* 92* 93*   CO2 26 26 24 25 27   GLUCOSE 106* 100* 98 144* 126*  BUN 14 14 14 17 17   CREATININE 0.68 0.64 0.62 0.67 0.60  CALCIUM 8.0* 7.8* 7.8* 8.1* 8.3*   Liver Function Tests:  Recent Labs Lab 11/01/13 0522  AST 23  ALT 13  ALKPHOS 42  BILITOT 0.7  PROT 6.8  ALBUMIN 3.1*   No results found for this basename: LIPASE, AMYLASE,  in the last 168 hours No results found for this basename: AMMONIA,  in the last 168 hours CBC:  Recent Labs Lab 10/31/13 1845 10/31/13 1900 11/01/13 0522 11/06/13 0526  WBC 7.5  --  8.0 9.8  NEUTROABS 5.2  --   --   --   HGB 12.4 13.6 12.6 11.9*  HCT 35.0* 40.0 35.7* 34.4*  MCV 88.2  --  88.4 87.8  PLT 222  --  197 254   Cardiac Enzymes: No results found for this basename: CKTOTAL, CKMB, CKMBINDEX, TROPONINI,  in the last 168 hours BNP (last 3 results)  Recent Labs  10/31/13 1853  PROBNP 4119.0*   CBG:  Recent Labs Lab 11/05/13 2126  GLUCAP 106*    Recent Results (from the past 240 hour(s))  MRSA PCR SCREENING     Status: Abnormal   Collection Time    11/01/13  5:45 AM      Result Value Range Status   MRSA by PCR POSITIVE (*) NEGATIVE Final   Comment:            The GeneXpert MRSA Assay (FDA     approved for NASAL specimens     only), is one component of a     comprehensive MRSA colonization     surveillance program. It is not     intended to diagnose MRSA     infection nor to guide or     monitor treatment for     MRSA infections.     RESULT CALLED TO, READ BACK BY AND VERIFIED WITH:     A.RIO,RN [id number] 11/01/13 M.CAMPBELL  CLOSTRIDIUM DIFFICILE BY PCR     Status: None   Collection Time    11/06/13  1:17 AM      Result Value Range Status   C difficile by pcr NEGATIVE  NEGATIVE Final     Studies: Dg Abd 2 Views  11/07/2013   CLINICAL DATA:  Abdominal distention and pain  EXAM: ABDOMEN - 2 VIEW  COMPARISON:  11/06/2013  FINDINGS: When compared to previous study there has been slight decompression of the gaseous distended loops  of large bowel. Air-filled loops of small bowel once again appreciated. There is levoscoliosis of the lumbar spine. Vascular calcifications identified within the lower abdominal aorta.  IMPRESSION: Findings again likely reflecting an ileus. There does appear to be slight decompression. Continued surveillance evaluation recommended.   Electronically Signed   By: Salome Holmes M.D.   On: 11/07/2013 14:09   Dg Abd 2 Views  11/06/2013   CLINICAL DATA:  Distended abdomen.  EXAM: ABDOMEN - 2 VIEW  COMPARISON:  11/03/2013.  FINDINGS:  Gas distended colon measuring up to 9.9 cm. This may represent an ileus related to inflammation although distal obstructing lesion not excluded. No free intraperitoneal air.  IMPRESSION: Gas distended colon measuring up to 9.9 cm. This may represent an ileus related to inflammation although distal obstructing lesion not excluded. No free intraperitoneal air.   Electronically Signed   By: Bridgett Larsson M.D.   On: 11/06/2013 14:07    Scheduled Meds: . amLODipine  5 mg Oral Daily  . atenolol  50 mg Oral Daily  . ciprofloxacin  500 mg Oral BID  . digoxin  0.125 mg Oral q morning - 10a  . feeding supplement (ENSURE COMPLETE)  237 mL Oral BID BM  . furosemide  20 mg Oral BID  . hydrALAZINE  10 mg Oral Q8H  . levothyroxine  125 mcg Oral QAC breakfast  . metoCLOPramide  5 mg Oral TID AC & HS  . metroNIDAZOLE  500 mg Oral Q8H  . mupirocin ointment  1 application Nasal BID  . ondansetron (ZOFRAN) IV  4 mg Intravenous Once  . polyethylene glycol  17 g Oral Daily  . potassium chloride  40 mEq Oral BID  . sodium chloride  3 mL Intravenous Q12H  . sodium chloride  2 g Oral BID WC  . Warfarin - Pharmacist Dosing Inpatient   Does not apply q1800   Continuous Infusions:     Time spent: 25 minutes    Shirley Mccormick  [facility] Pager [phone] If 7PM-7AM, please contact night-coverage at [email or web address], password [facility] 11/07/2013, 3:34 PM  LOS: 7 days

## 2013-11-07 NOTE — Progress Notes (Signed)
Admit: 10/31/2013 LOS: 7  66F with hypotonic euvolemic hyponatremia, SIADH, insetting of dementia, SNF residence, and admitted w/ fall and ? Colitis.    Subjective: NAEON Answers only the most basic questions Na up to 132 Surgery seeing for ?Olgilvie's Syndrome.     Sodium (mmol/L)  Date Value  11/07/2013 132*  11/06/2013 128*  11/05/2013 125*  11/04/2013 121*  11/03/2013 121*  11/02/2013 121*  11/01/2013 121*  11/01/2013 122*  10/31/2013 124*  10/31/2013 130*  10/27/2013 131*  10/02/2013 133*  10/01/2013 131*  ]  12/11 0701 - 12/12 0700 In: 420 [P.O.:420] Out: 1075 [Urine:1075]  Filed Weights   11/04/13 2100 11/05/13 2127 11/06/13 2042  Weight: 90.855 kg (200 lb 4.8 oz) 90.402 kg (199 lb 4.8 oz) 90.402 kg (199 lb 4.8 oz)    Current meds: reviewed including gabapentin, stopped Current Labs: reviewed   Physical Exam:  Blood pressure 185/81, pulse 90, temperature 98.4 F (36.9 C), temperature source Oral, resp. rate 20, height 5' 10.5" (1.791 m), weight 90.402 kg (199 lb 4.8 oz), SpO2 96.00%. GEN: frail appearing, lying in bed,  ENT: Has ecchymosis on L forehead  EYES: wearing glasses  CV: IRIR. Normal rate.  PULM: ctab, nl wob  ABD: distended, tender, soft, +BS  SKIN: no rashes  EXT: 2-3+ pitting edema  Assessment/Plan 1. SIADH: Pt with little solute intake. Na further improving.  No change to current plan [fluid restriction, NaCl tabs, low dose lasix] and this could be continued at SNF upon d/c.  Will sign off for now.  Pt should have Na monitored regularly upon d/c (weekly for 2-3 weeks) to make sure she is stable.  If her PO intake improves she should cont fluid restriction but NaCl tabs and lasix could be discontinued.    Sabra Heck MD 11/07/2013, 11:40 AM   Recent Labs Lab 11/05/13 0540 11/06/13 0526 11/07/13 0426  NA 125* 128* 132*  K 3.4* 3.5 3.3*  CL 87* 92* 93*  CO2 24 25 27   GLUCOSE 98 144* 126*  BUN 14 17 17   CREATININE 0.62 0.67 0.60  CALCIUM  7.8* 8.1* 8.3*    Recent Labs Lab 10/31/13 1845 10/31/13 1900 11/01/13 0522 11/06/13 0526  WBC 7.5  --  8.0 9.8  NEUTROABS 5.2  --   --   --   HGB 12.4 13.6 12.6 11.9*  HCT 35.0* 40.0 35.7* 34.4*  MCV 88.2  --  88.4 87.8  PLT 222  --  197 254    Current Facility-Administered Medications  Medication Dose Route Frequency Provider Last Rate Last Dose  . acetaminophen (TYLENOL) tablet 650 mg  650 mg Oral Q6H PRN Rolan Lipa, NP   650 mg at 11/05/13 1101  . albuterol (PROVENTIL) (5 MG/ML) 0.5% nebulizer solution 2.5 mg  2.5 mg Nebulization Q4H PRN Rolan Lipa, NP   2.5 mg at 11/01/13 9604  . amLODipine (NORVASC) tablet 5 mg  5 mg Oral Daily Kathlen Mody, MD      . atenolol (TENORMIN) tablet 50 mg  50 mg Oral Daily Lynden Oxford, MD   50 mg at 11/07/13 1059  . ciprofloxacin (CIPRO) tablet 500 mg  500 mg Oral BID Kathlen Mody, MD   500 mg at 11/07/13 0900  . digoxin (LANOXIN) tablet 0.125 mg  0.125 mg Oral q morning - 10a Lynden Oxford, MD   0.125 mg at 11/06/13 1049  . feeding supplement (ENSURE COMPLETE) (ENSURE COMPLETE) liquid 237 mL  237 mL Oral BID BM Heather  Cornelison Pitts, RD   237 mL at 11/06/13 1400  . furosemide (LASIX) tablet 20 mg  20 mg Oral BID Arita Miss, MD   20 mg at 11/07/13 0900  . hydrALAZINE (APRESOLINE) injection 2 mg  2 mg Intravenous Q6H PRN Kathlen Mody, MD      . hydrALAZINE (APRESOLINE) tablet 10 mg  10 mg Oral Q8H Nishant Dhungel, MD   10 mg at 11/07/13 0900  . levothyroxine (SYNTHROID, LEVOTHROID) tablet 125 mcg  125 mcg Oral QAC breakfast Lynden Oxford, MD   125 mcg at 11/07/13 0900  . metroNIDAZOLE (FLAGYL) tablet 500 mg  500 mg Oral Q8H Kathlen Mody, MD   500 mg at 11/07/13 0900  . mupirocin ointment (BACTROBAN) 2 % 1 application  1 application Nasal BID Kathlen Mody, MD   1 application at 11/07/13 1059  . ondansetron (ZOFRAN) injection 4 mg  4 mg Intravenous Once Vida Roller, MD      . ondansetron [facility]) tablet 4 mg  4 mg  Oral Q6H PRN Lynden Oxford, MD       Or  . ondansetron (ZOFRAN) injection 4 mg  4 mg Intravenous Q6H PRN Lynden Oxford, MD      . polyethylene glycol (MIRALAX / GLYCOLAX) packet 17 g  17 g Oral Daily Nishant Dhungel, MD   17 g at 11/06/13 1048  . potassium chloride SA (K-DUR,KLOR-CON) CR tablet 40 mEq  40 mEq Oral BID Kathlen Mody, MD   40 mEq at 11/07/13 1058  . sodium chloride 0.9 % injection 3 mL  3 mL Intravenous Q12H Lynden Oxford, MD   3 mL at 11/07/13 1059  . sodium chloride tablet 2 g  2 g Oral BID WC Arita Miss, MD   2 g at 11/07/13 0900  . Warfarin - Pharmacist Dosing Inpatient   Does not apply [address], [facility]

## 2013-11-07 NOTE — Progress Notes (Signed)
Utilization review completed.  

## 2013-11-08 ENCOUNTER — Inpatient Hospital Stay (HOSPITAL_COMMUNITY): Payer: Medicare Other

## 2013-11-08 DIAGNOSIS — Z7901 Long term (current) use of anticoagulants: Secondary | ICD-10-CM

## 2013-11-08 DIAGNOSIS — Z5181 Encounter for therapeutic drug level monitoring: Secondary | ICD-10-CM

## 2013-11-08 DIAGNOSIS — K6389 Other specified diseases of intestine: Secondary | ICD-10-CM

## 2013-11-08 LAB — CBC
MCH: 31.4 pg (ref 26.0–34.0)
MCHC: 34.8 g/dL (ref 30.0–36.0)
MCV: 90.2 fL (ref 78.0–100.0)
Platelets: 307 10*3/uL (ref 150–400)
RDW: 14.4 % (ref 11.5–15.5)

## 2013-11-08 LAB — BASIC METABOLIC PANEL
CO2: 29 mEq/L (ref 19–32)
Calcium: 8.7 mg/dL (ref 8.4–10.5)
Creatinine, Ser: 0.59 mg/dL (ref 0.50–1.10)
Glucose, Bld: 122 mg/dL — ABNORMAL HIGH (ref 70–99)

## 2013-11-08 MED ORDER — HYDRALAZINE HCL 25 MG PO TABS
25.0000 mg | ORAL_TABLET | Freq: Three times a day (TID) | ORAL | Status: DC
  Administered 2013-11-08 – 2013-11-11 (×9): 25 mg via ORAL
  Filled 2013-11-08 (×11): qty 1

## 2013-11-08 NOTE — Progress Notes (Signed)
[facility] PROGRESS NOTE  Shirley Mccormick WJX:[id number] DOB: [birth year] DOA: 10/31/2013 PCP: Gwen Pounds, MD  Brief narrative  79 year old female with history of A. fib on Coumadin, hypothyroidism, peripheral neuropathy, history of recent shingles, who was sent from nursing home with 2 episodes of fall which was thought to be mechanical. She also started having projectile vomiting on the day of admission. Head CT was unremarkable. Initially a cervical collar was applied and he woke on the floor today and a CT of the cervical spine was unremarkable for any acute injury. Patient also found to have abdominal distention with CT scan of the abdomen and pelvis showing wall thickening along the mid sigmoid colon concerning for infectious versus inflammatory process. Patient also found to have severe hyponatremia.    Assessment/Plan:  Hyponatremia  - resolved.  - Sodium recently was within normal limits. No NSAIDs or nephrotoxic meds used. Did not improve with IV hydration which was tried initially. Possibly SIADH. Placed on fluid restriction without much improvement. ( for past 72 hrs) .Serum osmolality was low. normal urine sodium and urine osmolality .  -Appreciated renal recommendation.  . Recommend to continue fluid restriction and placed on regular diet.Pt currently on sodium tablets in addition to fluid restriction and lasix,. Her sodium is improving.   -hold indapamide.  -TSH and  uric acid normal .  possible abdominal colitis  Patient does have abdominal distention with CT findings of infectious versus inflammation of admission.Marland Kitchen on empiric  Cipro and Flagyl, completed 5 days of antibiotics.  She continues to have loose BM. C DIFF pcr negative.  Vomiting has resolved. Abdominal x-ray repeated on 12/8 shows distended bowel gas possibly ileus,. No obstruction seen. Surgery consulted recommended ambulation, replete K and prokinetic agents. -Serial abdominal films, with clinical  indications.      Hypokalemia  replenish with KCl and repeat in am.   Postherpetic neuralgia  Hold off Pain medication at this time given her confusion.   A. fib  Rate controlled. Coumadin per pharmacy.   Cardiomyopathy  . Hold indapamide and Aldactone for now given hyponatremia. Continue digoxin. Continue atenolol   2-D echo done.  Hypothyroidism.  Continue Synthroid. Normal TSH   Dementia  Recent MMSE of 20   Code Status: DO NOT RESUSCITATE   Family Communication: Spoke with her husband over the phone on 12/12 Disposition Plan: Return to nursing home upon improvement of her sodium and abdominal distention .   Consultants:  Renal  Procedures:  None   Antibiotics:  IV vancomycin, (12 /5- 12/7)  Cipro and Flagyl (12/5--) plan to treat for 7 days.   HPI/Subjective:  Denies any abdominal pain. No nausea or vomiting. Abdominal distension persistent. One BM today.   Objective: Filed Vitals:   11/08/13 1318  BP: 147/73  Pulse: 77  Temp: 98 F (36.7 C)  Resp: 20    Intake/Output Summary (Last 24 hours) at 11/08/13 1441 Last data filed at 11/08/13 1300  Gross per 24 hour  Intake    540 ml  Output   1275 ml  Net   -735 ml   Filed Weights   11/05/13 2127 11/06/13 2042 11/07/13 2029  Weight: 90.402 kg (199 lb 4.8 oz) 90.402 kg (199 lb 4.8 oz) 90.402 kg (199 lb 4.8 oz)    Exam:  General: Elderly female lying in bed in no acute distress  HEENT: No pallor, no icterus, moist oral mucosa  Chest: Clear to auscultation bilaterally, no added sounds  CVS: Normal S1 and  S2, no murmurs rub or gallop  Abdomen: moderately distended, tympanic note to percussion, nontender, bowel sounds present  Extremities: trace edema bilaterally,  CNS: AAO x2, no focal deficit   Data Reviewed: Basic Metabolic Panel:  Recent Labs Lab 11/04/13 0550 11/05/13 0540 11/06/13 0526 11/07/13 0426 11/08/13 0538  NA 121* 125* 128* 132* 138  K 3.4* 3.4* 3.5 3.3* 3.8  CL 84* 87* 92*  93* 99  CO2 26 24 25 27 29   GLUCOSE 100* 98 144* 126* 122*  BUN 14 14 17 17 17   CREATININE 0.64 0.62 0.67 0.60 0.59  CALCIUM 7.8* 7.8* 8.1* 8.3* 8.7   Liver Function Tests: No results found for this basename: AST, ALT, ALKPHOS, BILITOT, PROT, ALBUMIN,  in the last 168 hours No results found for this basename: LIPASE, AMYLASE,  in the last 168 hours No results found for this basename: AMMONIA,  in the last 168 hours CBC:  Recent Labs Lab 11/06/13 0526 11/08/13 0538  WBC 9.8 11.6*  HGB 11.9* 13.1  HCT 34.4* 37.6  MCV 87.8 90.2  PLT 254 307   Cardiac Enzymes: No results found for this basename: CKTOTAL, CKMB, CKMBINDEX, TROPONINI,  in the last 168 hours BNP (last 3 results)  Recent Labs  10/31/13 1853  PROBNP 4119.0*   CBG:  Recent Labs Lab 11/05/13 2126  GLUCAP 106*    Recent Results (from the past 240 hour(s))  MRSA PCR SCREENING     Status: Abnormal   Collection Time    11/01/13  5:45 AM      Result Value Range Status   MRSA by PCR POSITIVE (*) NEGATIVE Final   Comment:            The GeneXpert MRSA Assay (FDA     approved for NASAL specimens     only), is one component of a     comprehensive MRSA colonization     surveillance program. It is not     intended to diagnose MRSA     infection nor to guide or     monitor treatment for     MRSA infections.     RESULT CALLED TO, READ BACK BY AND VERIFIED WITH:     A.RIO,RN 1610 11/01/13 M.CAMPBELL  CLOSTRIDIUM DIFFICILE BY PCR     Status: None   Collection Time    11/06/13  1:17 AM      Result Value Range Status   C difficile by pcr NEGATIVE  NEGATIVE Final     Studies: Dg Abd 2 Views  11/07/2013   CLINICAL DATA:  Abdominal distention and pain  EXAM: ABDOMEN - 2 VIEW  COMPARISON:  11/06/2013  FINDINGS: When compared to previous study there has been slight decompression of the gaseous distended loops of large bowel. Air-filled loops of small bowel once again appreciated. There is levoscoliosis of the lumbar  spine. Vascular calcifications identified within the lower abdominal aorta.  IMPRESSION: Findings again likely reflecting an ileus. There does appear to be slight decompression. Continued surveillance evaluation recommended.   Electronically Signed   By: Salome Holmes M.D.   On: 11/07/2013 14:09    Scheduled Meds: . amLODipine  5 mg Oral Daily  . atenolol  50 mg Oral Daily  . ciprofloxacin  500 mg Oral BID  . digoxin  0.125 mg Oral q morning - 10a  . feeding supplement (ENSURE COMPLETE)  237 mL Oral BID BM  . hydrALAZINE  25 mg Oral Q8H  . levothyroxine  125 mcg  Oral QAC breakfast  . metoCLOPramide  5 mg Oral TID AC & HS  . metroNIDAZOLE  500 mg Oral Q8H  . mupirocin ointment  1 application Nasal BID  . ondansetron (ZOFRAN) IV  4 mg Intravenous Once  . polyethylene glycol  17 g Oral Daily  . sodium chloride  3 mL Intravenous Q12H  . Warfarin - Pharmacist Dosing Inpatient   Does not apply q1800   Continuous Infusions:     Time spent: 25 minutes    Nury Nebergall  [facility] Pager [phone] If 7PM-7AM, please contact night-coverage at [email or web address], password [facility] 11/08/2013, 2:41 PM  LOS: 8 days

## 2013-11-08 NOTE — Progress Notes (Signed)
ANTICOAGULATION CONSULT NOTE - Follow Up Consult  Pharmacy Consult for Coumadin Indication: atrial fibrillation  Allergies  Allergen Reactions  . Erythromycin Rash  . Penicillins Rash  . Tetanus Toxoids Rash    Patient Measurements: Height: 5' 10.5" (179.1 cm) (per patient) Weight: 199 lb 4.8 oz (90.402 kg) IBW/kg (Calculated) : 69.65 Heparin Dosing Weight:   Vital Signs: Temp: 98.6 F (37 C) (12/13 0849) Temp src: Oral (12/13 0600) BP: 182/96 mmHg (12/13 0849) Pulse Rate: 93 (12/13 0849)  Labs:  Recent Labs  11/06/13 0526 11/07/13 0426 11/08/13 0538  HGB 11.9*  --  13.1  HCT 34.4*  --  37.6  PLT 254  --  307  LABPROT 32.3* 33.8* 35.2*  INR 3.29* 3.50* 3.69*  CREATININE 0.67 0.60 0.59    Estimated Creatinine Clearance: 62.2 ml/min (by C-G formula based on Cr of 0.59).   Medications:  Scheduled:  . amLODipine  5 mg Oral Daily  . atenolol  50 mg Oral Daily  . ciprofloxacin  500 mg Oral BID  . digoxin  0.125 mg Oral q morning - 10a  . feeding supplement (ENSURE COMPLETE)  237 mL Oral BID BM  . hydrALAZINE  25 mg Oral Q8H  . levothyroxine  125 mcg Oral QAC breakfast  . metoCLOPramide  5 mg Oral TID AC & HS  . metroNIDAZOLE  500 mg Oral Q8H  . mupirocin ointment  1 application Nasal BID  . ondansetron (ZOFRAN) IV  4 mg Intravenous Once  . polyethylene glycol  17 g Oral Daily  . sodium chloride  3 mL Intravenous Q12H  . Warfarin - Pharmacist Dosing Inpatient   Does not apply q1800    Assessment: 79yo female with AFib, INR continuing upward trend on antibiotics.  INR 3.69 this AM.  Hg & pltc wnl.  No bleeding problems noted.  Goal of Therapy:  INR 2-3 Monitor platelets by anticoagulation protocol: Yes   Plan:  No Coumadin today  Marisue Humble, PharmD Clinical Pharmacist [facility]

## 2013-11-09 DIAGNOSIS — I10 Essential (primary) hypertension: Secondary | ICD-10-CM

## 2013-11-09 LAB — BASIC METABOLIC PANEL
BUN: 21 mg/dL (ref 6–23)
Calcium: 8.7 mg/dL (ref 8.4–10.5)
Creatinine, Ser: 0.62 mg/dL (ref 0.50–1.10)
GFR calc Af Amer: >90 mL/min (ref >90)
GFR calc non Af Amer: 79 mL/min — ABNORMAL LOW (ref >90)
Glucose, Bld: 122 mg/dL — ABNORMAL HIGH (ref 70–99)

## 2013-11-09 LAB — PROTIME-INR: INR: 3.59 — ABNORMAL HIGH (ref 0.00–1.49)

## 2013-11-09 NOTE — Progress Notes (Signed)
[facility] PROGRESS NOTE  Shirley Mccormick ZOX:[id number] DOB: [birth year] DOA: 10/31/2013 PCP: Gwen Pounds, MD  Brief narrative  79 year old female with history of A. fib on Coumadin, hypothyroidism, peripheral neuropathy, history of recent shingles, who was sent from nursing home with 2 episodes of fall which was thought to be mechanical. She also started having projectile vomiting on the day of admission. Head CT was unremarkable. Initially a cervical collar was applied and he woke on the floor today and a CT of the cervical spine was unremarkable for any acute injury. Patient also found to have abdominal distention with CT scan of the abdomen and pelvis showing wall thickening along the mid sigmoid colon concerning for infectious versus inflammatory process. Patient also found to have severe hyponatremia.    Assessment/Plan:  Hyponatremia  - resolved.  - Sodium recently was within normal limits. No NSAIDs or nephrotoxic meds used. Did not improve with IV hydration which was tried initially. Possibly SIADH. Placed on fluid restriction without much improvement. ( for past 72 hrs) .Serum osmolality was low. normal urine sodium and urine osmolality .  -Appreciated renal recommendation.  . Recommend to continue fluid restriction and placed on regular diet.Pt currently on sodium tablets in addition to fluid restriction and lasix,. Her sodium is improving.   -hold indapamide.  -TSH and  uric acid normal .  possible abdominal colitis  Patient does have abdominal distention with CT findings of infectious versus inflammation of admission.Marland Kitchen on empiric  Cipro and Flagyl, completed 5 days of antibiotics.  She continues to have loose BM. C DIFF pcr negative.  Vomiting has resolved. Abdominal x-ray repeated on 12/8 shows distended bowel gas possibly ileus,. No obstruction seen. Surgery consulted recommended ambulation, replete K and prokinetic agents. -Serial abdominal films, with clinical  indications.      Hypokalemia  replenish with KCl and repeat in am.   Postherpetic neuralgia  Hold off Pain medication at this time given her confusion.   A. fib  Rate controlled. Coumadin per pharmacy.   Cardiomyopathy  . Hold indapamide and Aldactone for now given hyponatremia. Continue digoxin. Continue atenolol   2-D echo done.  Hypothyroidism.  Continue Synthroid. Normal TSH   Dementia  Recent MMSE of 20   Code Status: DO NOT RESUSCITATE   Family Communication: Spoke with her husband over the phone on 12/14 Disposition Plan: Return to nursing home upon improvement of her sodium and abdominal distention .   Consultants:  Renal  Procedures:  None   Antibiotics:  IV vancomycin, (12 /5- 12/7)  Cipro and Flagyl (12/5--) plan to treat for 7 days.   HPI/Subjective:  Denies any abdominal pain. No nausea or vomiting. Abdominal distension persistent. Bm Today.  Objective: Filed Vitals:   11/09/13 1356  BP: 139/48  Pulse: 86  Temp: 98.7 F (37.1 C)  Resp: 18    Intake/Output Summary (Last 24 hours) at 11/09/13 1625 Last data filed at 11/09/13 1300  Gross per 24 hour  Intake    220 ml  Output    850 ml  Net   -630 ml   Filed Weights   11/06/13 2042 11/07/13 2029 11/08/13 2102  Weight: 90.402 kg (199 lb 4.8 oz) 90.402 kg (199 lb 4.8 oz) 90.175 kg (198 lb 12.8 oz)    Exam:  General: Elderly female lying in bed in no acute distress  HEENT: No pallor, no icterus, moist oral mucosa  Chest: Clear to auscultation bilaterally, no added sounds  CVS: Normal S1 and S2,  no murmurs rub or gallop  Abdomen: moderately distended, tympanic note to percussion, nontender, bowel sounds present  Extremities: trace edema bilaterally,  CNS: AAO x2, no focal deficit   Data Reviewed: Basic Metabolic Panel:  Recent Labs Lab 11/05/13 0540 11/06/13 0526 11/07/13 0426 11/08/13 0538 11/09/13 0440  NA 125* 128* 132* 138 138  K 3.4* 3.5 3.3* 3.8 3.7  CL 87* 92* 93* 99 97   CO2 24 25 27 29 28   GLUCOSE 98 144* 126* 122* 122*  BUN 14 17 17 17 21   CREATININE 0.62 0.67 0.60 0.59 0.62  CALCIUM 7.8* 8.1* 8.3* 8.7 8.7   Liver Function Tests: No results found for this basename: AST, ALT, ALKPHOS, BILITOT, PROT, ALBUMIN,  in the last 168 hours No results found for this basename: LIPASE, AMYLASE,  in the last 168 hours No results found for this basename: AMMONIA,  in the last 168 hours CBC:  Recent Labs Lab 11/06/13 0526 11/08/13 0538  WBC 9.8 11.6*  HGB 11.9* 13.1  HCT 34.4* 37.6  MCV 87.8 90.2  PLT 254 307   Cardiac Enzymes: No results found for this basename: CKTOTAL, CKMB, CKMBINDEX, TROPONINI,  in the last 168 hours BNP (last 3 results)  Recent Labs  10/31/13 1853  PROBNP 4119.0*   CBG:  Recent Labs Lab 11/05/13 2126  GLUCAP 106*    Recent Results (from the past 240 hour(s))  MRSA PCR SCREENING     Status: Abnormal   Collection Time    11/01/13  5:45 AM      Result Value Range Status   MRSA by PCR POSITIVE (*) NEGATIVE Final   Comment:            The GeneXpert MRSA Assay (FDA     approved for NASAL specimens     only), is one component of a     comprehensive MRSA colonization     surveillance program. It is not     intended to diagnose MRSA     infection nor to guide or     monitor treatment for     MRSA infections.     RESULT CALLED TO, READ BACK BY AND VERIFIED WITH:     A.RIO,RN [id number] 11/01/13 M.CAMPBELL  CLOSTRIDIUM DIFFICILE BY PCR     Status: None   Collection Time    11/06/13  1:17 AM      Result Value Range Status   C difficile by pcr NEGATIVE  NEGATIVE Final     Studies: Dg Abd 2 Views  11/08/2013   CLINICAL DATA:  Abdominal distention  EXAM: ABDOMEN - 2 VIEW  COMPARISON:  11/07/2013  FINDINGS: Scattered large and small bowel gas is noted. Mild colonic distention is seen. No free air is seen. The overall appearance is stable from the prior exam. These changes to are most consistent with an ileus.  IMPRESSION:  Changes most consistent with an ileus. The overall appearance is stable from the previous exam.   Electronically Signed   By: Alcide Clever M.D.   On: 11/08/2013 15:53    Scheduled Meds: . amLODipine  5 mg Oral Daily  . atenolol  50 mg Oral Daily  . digoxin  0.125 mg Oral q morning - 10a  . feeding supplement (ENSURE COMPLETE)  237 mL Oral BID BM  . hydrALAZINE  25 mg Oral Q8H  . levothyroxine  125 mcg Oral QAC breakfast  . metoCLOPramide  5 mg Oral TID AC & HS  . mupirocin ointment  1 application Nasal BID  . ondansetron (ZOFRAN) IV  4 mg Intravenous Once  . polyethylene glycol  17 g Oral Daily  . sodium chloride  3 mL Intravenous Q12H  . Warfarin - Pharmacist Dosing Inpatient   Does not apply q1800   Continuous Infusions:     Time spent: 25 minutes    Easter Kennebrew  [facility] Pager [phone] If 7PM-7AM, please contact night-coverage at [email or web address], password [facility] 11/09/2013, 4:25 PM  LOS: 9 days

## 2013-11-09 NOTE — Progress Notes (Signed)
ANTICOAGULATION CONSULT NOTE - Follow Up Consult  Pharmacy Consult for Coumadin Indication: atrial fibrillation  Allergies  Allergen Reactions  . Erythromycin Rash  . Penicillins Rash  . Tetanus Toxoids Rash    Patient Measurements: Height: 5\' 5"  (165.1 cm) Weight: 198 lb 12.8 oz (90.175 kg) IBW/kg (Calculated) : 57 Heparin Dosing Weight:   Vital Signs: Temp: 99.2 F (37.3 C) (12/14 0753) Temp src: Oral (12/14 0504) BP: 157/103 mmHg (12/14 0753) Pulse Rate: 107 (12/14 0753)  Labs:  Recent Labs  11/07/13 0426 11/08/13 0538 11/09/13 0440  HGB  --  13.1  --   HCT  --  37.6  --   PLT  --  307  --   LABPROT 33.8* 35.2* 34.5*  INR 3.50* 3.69* 3.59*  CREATININE 0.60 0.59 0.62    Estimated Creatinine Clearance: 56 ml/min (by C-G formula based on Cr of 0.62).   Medications:  Scheduled:  . amLODipine  5 mg Oral Daily  . atenolol  50 mg Oral Daily  . digoxin  0.125 mg Oral q morning - 10a  . feeding supplement (ENSURE COMPLETE)  237 mL Oral BID BM  . hydrALAZINE  25 mg Oral Q8H  . levothyroxine  125 mcg Oral QAC breakfast  . metoCLOPramide  5 mg Oral TID AC & HS  . mupirocin ointment  1 application Nasal BID  . ondansetron (ZOFRAN) IV  4 mg Intravenous Once  . polyethylene glycol  17 g Oral Daily  . sodium chloride  3 mL Intravenous Q12H  . Warfarin - Pharmacist Dosing Inpatient   Does not apply q1800    Assessment: 79yo female on Coumadin for AFib.  INR 3.59 on Cipro and Flagyl.  Hg and Pltc wnl on 12/13.  No bleeding problems noted.  Goal of Therapy:  INR 2-3 Monitor platelets by anticoagulation protocol: Yes   Plan:  No Coumadin today F/U in AM  Marisue Humble, PharmD Clinical Pharmacist [facility]

## 2013-11-10 ENCOUNTER — Inpatient Hospital Stay (HOSPITAL_COMMUNITY): Payer: Medicare Other

## 2013-11-10 LAB — BASIC METABOLIC PANEL
BUN: 26 mg/dL — ABNORMAL HIGH (ref 6–23)
CO2: 30 mEq/L (ref 19–32)
Calcium: 9.1 mg/dL (ref 8.4–10.5)
Creatinine, Ser: 0.72 mg/dL (ref 0.50–1.10)
GFR calc non Af Amer: 76 mL/min — ABNORMAL LOW (ref >90)
Glucose, Bld: 127 mg/dL — ABNORMAL HIGH (ref 70–99)
Sodium: 143 mEq/L (ref 135–145)

## 2013-11-10 MED ORDER — AMLODIPINE BESYLATE 5 MG PO TABS: 5.0000 mg | ORAL_TABLET | Freq: Every day | ORAL | Status: DC

## 2013-11-10 MED ORDER — ENSURE COMPLETE PO LIQD: 237.0000 mL | Freq: Two times a day (BID) | ORAL | Status: DC

## 2013-11-10 MED ORDER — HYDRALAZINE HCL 25 MG PO TABS: 25.0000 mg | ORAL_TABLET | Freq: Three times a day (TID) | ORAL | Status: DC

## 2013-11-10 MED ORDER — IOHEXOL 300 MG/ML  SOLN: 25.0000 mL | INTRAMUSCULAR | Status: DC

## 2013-11-10 MED ORDER — WARFARIN SODIUM 2 MG PO TABS
2.0000 mg | ORAL_TABLET | Freq: Once | ORAL | Status: AC
  Administered 2013-11-10: 2 mg via ORAL
  Filled 2013-11-10: qty 1

## 2013-11-10 MED ORDER — IOHEXOL 300 MG/ML  SOLN
25.0000 mL | INTRAMUSCULAR | Status: AC
  Administered 2013-11-10 (×2): 25 mL via ORAL

## 2013-11-10 MED ORDER — IOHEXOL 300 MG/ML  SOLN
100.0000 mL | Freq: Once | INTRAMUSCULAR | Status: AC | PRN
  Administered 2013-11-10: 100 mL via INTRAVENOUS

## 2013-11-10 NOTE — Discharge Summary (Signed)
Physician Discharge Summary  Shirley Mccormick UJW:[id number] DOB: December 17, 1924 DOA: 10/31/2013  PCP: Gwen Pounds, MD  Admit date: 10/31/2013 Discharge date: 11/10/2013  Time spent: 28 minutes  Recommendations for Outpatient Follow-up:  1. Follow up with PCP IN ONE WEEK  Discharge Diagnoses:  Principal Problem:   Cellulitis Active Problems:   Hyponatremia   A-fib   Postherpetic neuralgia   Fall at nursing home   Projectile vomiting   Other and unspecified noninfectious gastroenteritis and colitis(558.9)   CHF (congestive heart failure)   Hypokalemia   Noninfectious gastroenteritis and colitis   Accelerated hypertension   Colon distention   Discharge Condition: improved  Diet recommendation: regular diet  Filed Weights   11/07/13 2029 11/08/13 2102 11/09/13 2202  Weight: 90.402 kg (199 lb 4.8 oz) 90.175 kg (198 lb 12.8 oz) 85.095 kg (187 lb 9.6 oz)    History of present illness:  79 year old female with history of A. fib on Coumadin, hypothyroidism, peripheral neuropathy, history of recent shingles, who was sent from nursing home with 2 episodes of fall which was thought to be mechanical. She also started having projectile vomiting on the day of admission. Head CT was unremarkable. Initially a cervical collar was applied and he woke on the floor today and a CT of the cervical spine was unremarkable for any acute injury. Patient also found to have abdominal distention with CT scan of the abdomen and pelvis showing wall thickening along the mid sigmoid colon concerning for infectious versus inflammatory process. Patient also found to have severe hyponatremia.    Hospital Course:  Hyponatremia  - resolved.  - Sodium recently was within normal limits. No NSAIDs or nephrotoxic meds used. Did not improve with IV hydration which was tried initially. Possibly SIADH. Placed on fluid restriction without much improvement. ( for past 72 hrs) .Serum osmolality was low. normal urine sodium  and urine osmolality .  -Appreciated renal recommendation. . Recommend to continue fluid restriction and placed on regular diet.Pt currently on sodium tablets in addition to fluid restriction and lasix,. Her sodium is improving.  -hold indapamide.  -TSH and uric acid normal .  possible abdominal colitis  Patient does have abdominal distention with CT findings of infectious versus inflammation of admission.Marland Kitchen on empiric Cipro and Flagyl, completed 5 days of antibiotics. She continues to have loose BM. C DIFF pcr negative. Vomiting has resolved. Abdominal x-ray repeated on 12/8 shows distended bowel gas possibly ileus,. No obstruction seen. Surgery consulted recommended ambulation, replete K and prokinetic agents.  -Serial abdominal films, with clinical indications.  Hypokalemia  replenish with KCl and repeat in am.  Postherpetic neuralgia  Hold off Pain medication at this time given her confusion.  A. fib  Rate controlled. Coumadin per pharmacy.  Cardiomyopathy  . Hold indapamide and Aldactone for now given hyponatremia. Continue digoxin. Continue atenolol  2-D echo done.  Hypothyroidism.  Continue Synthroid. Normal TSH  Dementia  Recent MMSE of 20      Procedures:  CT abd and pelvis  Consultations:  Surgery  renal  Discharge Exam: Filed Vitals:   11/10/13 1400  BP: 152/71  Pulse: 76  Temp: 98 F (36.7 C)  Resp: 20    General: Elderly female lying in bed in no acute distress  HEENT: No pallor, no icterus, moist oral mucosa  Chest: Clear to auscultation bilaterally, no added sounds  CVS: Normal S1 and S2, no murmurs rub or gallop  Abdomen: moderately distended, tympanic note to percussion, nontender, bowel sounds present  Extremities: trace edema bilaterally,  CNS: AAO x2, no focal deficit   Discharge Instructions     Medication List    ASK your doctor about these medications       acetaminophen 325 MG tablet  Commonly known as:  TYLENOL  Take 650 mg by  mouth every 6 (six) hours as needed for mild pain.     atenolol 50 MG tablet  Commonly known as:  TENORMIN  Take 50 mg by mouth daily.     CERTAVITE/ANTIOXIDANTS PO  Take 1 tablet by mouth every morning.     cholecalciferol 1000 UNITS tablet  Commonly known as:  VITAMIN D  Take 1,000 Units by mouth every morning.     digoxin 0.125 MG tablet  Commonly known as:  LANOXIN  Take 0.125 mg by mouth every morning.     diphenhydramine-acetaminophen 25-500 MG Tabs  Commonly known as:  TYLENOL PM  Take 2 tablets by mouth at bedtime.     fish oil-omega-3 fatty acids 1000 MG capsule  Take 1 g by mouth 2 (two) times daily.     gabapentin 100 MG capsule  Commonly known as:  NEURONTIN  Take 100 mg by mouth 3 (three) times daily.     glucosamine-chondroitin 500-400 MG tablet  Take 1 tablet by mouth 3 (three) times daily.     HYDROcodone-acetaminophen 5-325 MG per tablet  Commonly known as:  NORCO/VICODIN  Take 1 tablet by mouth every 6 (six) hours.     hydrOXYzine 25 MG tablet  Commonly known as:  ATARAX/VISTARIL  Take 25 mg by mouth every 8 (eight) hours as needed for itching.     indapamide 2.5 MG tablet  Commonly known as:  LOZOL  Take 2.5 mg by mouth every morning.     ipratropium-albuterol 0.5-2.5 (3) MG/3ML Soln  Commonly known as:  DUONEB  Take 3 mLs by nebulization 3 (three) times daily. For 3 days only. START 12.3.14 END 12.5.14     levothyroxine 125 MCG tablet  Commonly known as:  SYNTHROID, LEVOTHROID  Take 125 mcg by mouth daily.     potassium chloride 10 MEQ tablet  Commonly known as:  K-DUR  Take 10 mEq by mouth 2 (two) times daily.     spironolactone 25 MG tablet  Commonly known as:  ALDACTONE  Take 25 mg by mouth every morning.     warfarin 6 MG tablet  Commonly known as:  COUMADIN  Take 6 mg by mouth daily at 6 PM.     warfarin 1 MG tablet  Commonly known as:  COUMADIN  Take 7 mg by mouth daily at 6 PM.       Allergies  Allergen Reactions  .  Erythromycin Rash  . Penicillins Rash  . Tetanus Toxoids Rash      The results of significant diagnostics from this hospitalization (including imaging, microbiology, ancillary and laboratory) are listed below for reference.    Significant Diagnostic Studies: Ct Head Wo Contrast  10/31/2013   CLINICAL DATA:  Projectile vomiting this morning.  Fell 3 days ago.  EXAM: CT HEAD WITHOUT CONTRAST  CT CERVICAL SPINE WITHOUT CONTRAST  TECHNIQUE: Multidetector CT imaging of the head and cervical spine was performed following the standard protocol without intravenous contrast. Multiplanar CT image reconstructions of the cervical spine were also generated.  COMPARISON:  None.  FINDINGS: CT HEAD FINDINGS  Diffusely enlarged ventricles and subarachnoid spaces. Sphenoid sinus air-fluid level. No skull fracture or intracranial hemorrhage seen.  CT CERVICAL  SPINE FINDINGS  Multilevel degenerative changes. These include facet degenerative changes at multiple levels, most pronounced on the left at the C3-4 level, with associated 3 mm of anterolisthesis at that level. No prevertebral soft tissue swelling or fractures seen. Biapical parenchymal scarring with calcification. Previously noted sphenoid sinus air-fluid level. No skull base fracture visualized.  IMPRESSION: 1. Acute left sphenoid sinusitis vs. blood in the sinus. 2. No skull fracture or intracranial hemorrhage seen. 3. No cervical spine fracture or traumatic subluxation. 4. Multilevel cervical spine degenerative changes. 5. Atrophy and chronic small vessel white matter ischemic changes in both cerebral hemispheres.   Electronically Signed   By: Gordan Payment M.D.   On: 10/31/2013 20:24   Ct Cervical Spine Wo Contrast  10/31/2013   CLINICAL DATA:  Projectile vomiting this morning.  Fell 3 days ago.  EXAM: CT HEAD WITHOUT CONTRAST  CT CERVICAL SPINE WITHOUT CONTRAST  TECHNIQUE: Multidetector CT imaging of the head and cervical spine was performed following the  standard protocol without intravenous contrast. Multiplanar CT image reconstructions of the cervical spine were also generated.  COMPARISON:  None.  FINDINGS: CT HEAD FINDINGS  Diffusely enlarged ventricles and subarachnoid spaces. Sphenoid sinus air-fluid level. No skull fracture or intracranial hemorrhage seen.  CT CERVICAL SPINE FINDINGS  Multilevel degenerative changes. These include facet degenerative changes at multiple levels, most pronounced on the left at the C3-4 level, with associated 3 mm of anterolisthesis at that level. No prevertebral soft tissue swelling or fractures seen. Biapical parenchymal scarring with calcification. Previously noted sphenoid sinus air-fluid level. No skull base fracture visualized.  IMPRESSION: 1. Acute left sphenoid sinusitis vs. blood in the sinus. 2. No skull fracture or intracranial hemorrhage seen. 3. No cervical spine fracture or traumatic subluxation. 4. Multilevel cervical spine degenerative changes. 5. Atrophy and chronic small vessel white matter ischemic changes in both cerebral hemispheres.   Electronically Signed   By: Gordan Payment M.D.   On: 10/31/2013 20:24   Ct Abdomen Pelvis W Contrast  11/10/2013   CLINICAL DATA:  Followup to abdominal CT performed on 10/31/2013 showing wall thickening of the mid sigmoid colon.  EXAM: CT ABDOMEN AND PELVIS WITH CONTRAST  TECHNIQUE: Multidetector CT imaging of the abdomen and pelvis was performed using the standard protocol following bolus administration of intravenous contrast.  CONTRAST:  OMNIPAQUE IOHEXOL 300 MG/ML  SOLN  COMPARISON:  Radiographs, 11/08/2013.  CT, 10/31/2013.  FINDINGS: The colon is now more diffusely distended. There are no areas of wall thickening. No colonic inflammatory change is seen. The colon is distended as is the rectum. There are air-fluid levels. There is no small bowel dilation. The stomach is mostly decompressed.  Stable lung base reticular scarring and subsegmental atelectasis. There  are few tiny lung base nodules bilaterally. Small left pleural effusion. Stable cardiomegaly.  Liver and spleen are unremarkable. The gallbladder is mostly decompressed as it was previously. No bile duct dilation. The pancreas is unremarkable. No significant bile duct dilation. There is bilateral adrenal gland thickening likely due to hyperplasia. This is stable. Mild, right greater than left, renal cortical thinning is noted. No renal mass is seen. No hydronephrosis. The ureters and bladder are unremarkable.  There is calcification in uterus likely in a small fibroid. The uterus and adnexa are otherwise unremarkable.  No pathologically enlarged lymph nodes.  There is no ascites.  There are significant degenerative changes of the lumbar spine. The bones are diffusely demineralized. No osteoblastic or osteolytic lesions.  IMPRESSION: 1. There  is now no evidence of colonic inflammation. There is no bowel wall thickening. The appearance on the current exam is consistent with a diffuse colonic adynamic ileus with mild distention of the colon and rectum and air-fluid levels. 2. No obstruction. The stomach is decompressed and the small bowel normal in caliber. 3. Cardiomegaly, lung base scarring/subsegmental atelectasis and small left effusion, stable from the prior study. 4. Chronic findings in the abdomen and pelvis as detailed above all stable.   Electronically Signed   By: Amie Portland M.D.   On: 11/10/2013 16:44   Ct Abdomen Pelvis W Contrast  10/31/2013   CLINICAL DATA:  Abdominal distention, vomiting and fall. Altered mental status.  EXAM: CT ABDOMEN AND PELVIS WITH CONTRAST  TECHNIQUE: Multidetector CT imaging of the abdomen and pelvis was performed using the standard protocol following bolus administration of intravenous contrast.  CONTRAST:  OMNIPAQUE IOHEXOL 300 MG/ML  SOLN  COMPARISON:  Abdominal radiograph performed earlier today at 7:35 p.m.  FINDINGS: A trace left pleural effusion is noted, with  mild bilateral peripheral scarring.  There is a slightly nodular contour to the liver, without definite evidence of cirrhotic change. The spleen is unremarkable in appearance; vascular calcification is noted at the splenic hilum. The gallbladder is decompressed and not well assessed. The pancreas and adrenal glands are within normal limits.  The kidneys are unremarkable in appearance. There is no evidence of hydronephrosis. No renal or ureteral stones are seen. Nonspecific perinephric stranding is noted bilaterally.  No free fluid is identified. The small bowel is unremarkable in appearance. The stomach is within normal limits. No acute vascular abnormalities are seen. Diffuse calcification is noted along the abdominal aorta and its branches, with mild thrombus noted along the distal descending thoracic aorta.  The appendix is normal in caliber, without evidence for appendicitis. There is intermittent distention of the colon, still within normal limits in caliber, with fluid seen filling the mid to distal sigmoid colon and rectum. Apparent mild wall thickening is noted along the mid sigmoid colon, possibly reflecting a mild infectious or inflammatory process. The colon is mildly redundant.  The bladder is mildly distended and grossly unremarkable in appearance. The uterus remains normal in size, with a single small calcified fibroid. The ovaries are relatively symmetric ; no suspicious adnexal masses are seen. Diffuse presacral stranding is nonspecific in appearance. No inguinal lymphadenopathy is seen.  No acute osseous abnormalities are identified. Left convex lumbar scoliosis is noted, with associated vacuum phenomenon.  IMPRESSION: 1. Intermittent distention of the colon, still within normal limits in caliber, with fluid filling the mid to distal sigmoid colon and rectum. Apparent mild wall thickening along the mid sigmoid colon may reflect a mild infectious or inflammatory process. 2. Trace left pleural  effusion, with mild bilateral peripheral scarring. 3. Diffuse calcification along the abdominal aorta and its branches, with mild thrombus along the distal descending thoracic aorta. 4. Nonspecific diffuse presacral stranding noted. 5. Left convex lumbar scoliosis noted.   Electronically Signed   By: Roanna Raider M.D.   On: 10/31/2013 22:52   Dg Abd 2 Views  11/08/2013   CLINICAL DATA:  Abdominal distention  EXAM: ABDOMEN - 2 VIEW  COMPARISON:  11/07/2013  FINDINGS: Scattered large and small bowel gas is noted. Mild colonic distention is seen. No free air is seen. The overall appearance is stable from the prior exam. These changes to are most consistent with an ileus.  IMPRESSION: Changes most consistent with an ileus. The overall  appearance is stable from the previous exam.   Electronically Signed   By: Alcide Clever M.D.   On: 11/08/2013 15:53   Dg Abd 2 Views  11/07/2013   CLINICAL DATA:  Abdominal distention and pain  EXAM: ABDOMEN - 2 VIEW  COMPARISON:  11/06/2013  FINDINGS: When compared to previous study there has been slight decompression of the gaseous distended loops of large bowel. Air-filled loops of small bowel once again appreciated. There is levoscoliosis of the lumbar spine. Vascular calcifications identified within the lower abdominal aorta.  IMPRESSION: Findings again likely reflecting an ileus. There does appear to be slight decompression. Continued surveillance evaluation recommended.   Electronically Signed   By: Salome Holmes M.D.   On: 11/07/2013 14:09   Dg Abd 2 Views  11/06/2013   CLINICAL DATA:  Distended abdomen.  EXAM: ABDOMEN - 2 VIEW  COMPARISON:  11/03/2013.  FINDINGS: Gas distended colon measuring up to 9.9 cm. This may represent an ileus related to inflammation although distal obstructing lesion not excluded. No free intraperitoneal air.  IMPRESSION: Gas distended colon measuring up to 9.9 cm. This may represent an ileus related to inflammation although distal  obstructing lesion not excluded. No free intraperitoneal air.   Electronically Signed   By: Bridgett Larsson M.D.   On: 11/06/2013 14:07   Dg Abd Acute W/chest  10/31/2013   CLINICAL DATA:  Distended abdomen.  EXAM: ACUTE ABDOMEN SERIES (ABDOMEN 2 VIEW & CHEST 1 VIEW)  COMPARISON:  CT stress set chest radiograph from 4/18/7  FINDINGS: Moderate distension of the large bowel loops identified. No radiopaque calculi or other significant radiographic abnormality is seen. Heart size is mildly enlarged. There is blunting of the costophrenic angles as well as pulmonary vascular congestion. Both lungs are clear.  IMPRESSION: 1. Moderate distension of the large bowel loops identified. Findings may reflect colonic ileus or distal bowel obstruction. Consider further assessment with CT of the abdomen and pelvis with oral contrast material. 2. Suspect mild CHF.   Electronically Signed   By: Signa Kell M.D.   On: 10/31/2013 19:49   Dg Abd Portable 1v  11/03/2013   CLINICAL DATA:  Abdominal distension question obstruction  EXAM: PORTABLE ABDOMEN - 1 VIEW  COMPARISON:  Portable exam 1218 hr compared to 10/31/2013  FINDINGS: Air-filled loops of large and small bowel throughout abdomen.  No definite bowel wall thickening.  Bones appear diffusely demineralized with mild scattered degenerative changes and scoliosis of the lumbar spine.  No pathologic calcifications.  IMPRESSION: Nonspecific bowel gas pattern with air-filled loops of large and small bowel throughout abdomen.  No definite evidence of obstruction or wall thickening.   Electronically Signed   By: Ulyses Southward M.D.   On: 11/03/2013 14:21    Microbiology: Recent Results (from the past 240 hour(s))  MRSA PCR SCREENING     Status: Abnormal   Collection Time    11/01/13  5:45 AM      Result Value Range Status   MRSA by PCR POSITIVE (*) NEGATIVE Final   Comment:            The GeneXpert MRSA Assay (FDA     approved for NASAL specimens     only), is one component  of a     comprehensive MRSA colonization     surveillance program. It is not     intended to diagnose MRSA     infection nor to guide or     monitor treatment for  MRSA infections.     RESULT CALLED TO, READ BACK BY AND VERIFIED WITH:     A.RIO,RN 1610 11/01/13 M.CAMPBELL  CLOSTRIDIUM DIFFICILE BY PCR     Status: None   Collection Time    11/06/13  1:17 AM      Result Value Range Status   C difficile by pcr NEGATIVE  NEGATIVE Final     Labs: Basic Metabolic Panel:  Recent Labs Lab 11/06/13 0526 11/07/13 0426 11/08/13 0538 11/09/13 0440 11/10/13 0615  NA 128* 132* 138 138 143  K 3.5 3.3* 3.8 3.7 4.2  CL 92* 93* 99 97 101  CO2 25 27 29 28 30   GLUCOSE 144* 126* 122* 122* 127*  BUN 17 17 17 21  26*  CREATININE 0.67 0.60 0.59 0.62 0.72  CALCIUM 8.1* 8.3* 8.7 8.7 9.1   Liver Function Tests: No results found for this basename: AST, ALT, ALKPHOS, BILITOT, PROT, ALBUMIN,  in the last 168 hours No results found for this basename: LIPASE, AMYLASE,  in the last 168 hours No results found for this basename: AMMONIA,  in the last 168 hours CBC:  Recent Labs Lab 11/06/13 0526 11/08/13 0538  WBC 9.8 11.6*  HGB 11.9* 13.1  HCT 34.4* 37.6  MCV 87.8 90.2  PLT 254 307   Cardiac Enzymes: No results found for this basename: CKTOTAL, CKMB, CKMBINDEX, TROPONINI,  in the last 168 hours BNP: BNP (last 3 results)  Recent Labs  10/31/13 1853  PROBNP 4119.0*   CBG:  Recent Labs Lab 11/05/13 2126  GLUCAP 106*       Signed:  Amanada Philbrick  [facility] 11/10/2013, 4:58 PM

## 2013-11-10 NOTE — Progress Notes (Signed)
NUTRITION FOLLOW-UP  DOCUMENTATION CODES Per approved criteria  -Not Applicable   INTERVENTION: Continue Ensure Complete po BID, each supplement provides 350 kcal and 13 grams of protein. RD to continue to follow nutrition care plan.  NUTRITION DIAGNOSIS: Inadequate oral intake related to poor appetite as evidenced by Meal Completion: <25%. Ongoing.  Goal: Pt to meet >/= 90% of their estimated nutrition needs   Monitor:  PO intake, supplement acceptance, weight trend, labs  ASSESSMENT: Pt admitted from SNF after a fall. Pt with possible cellulitis of her leg, hypokalemia, hyponatremia (possibly SIADH), and possible abdominal colitis. Pt with abdominal distention. 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 Sodium now WNL at 143. Per most recent MD note, plan for return to nursing home with improvement of sodium and abdominal distention.  Pt has consumed at least half of an Ensure today. She confirms that she is having poor oral intake.  Height: Ht Readings from Last 1 Encounters:  11/09/13 5\' 5"  (1.651 m)    Weight: Wt Readings from Last 1 Encounters:  11/09/13 187 lb 9.6 oz (85.095 kg)   BMI:  Body mass index is 31.22 kg/(m^2).  Estimated Nutritional Needs: Kcal: 1750-1900 Protein: 85-95 grams Fluid: > 1.5 L/day  Skin: stage I pressure ulcer to R and L heels  Diet Order: General Meal Completion: 0-30%   EDUCATION NEEDS: -No education needs identified at this time   Intake/Output Summary (Last 24 hours) at 11/10/13 1445 Last data filed at 11/10/13 1300  Gross per 24 hour  Intake    340 ml  Output    575 ml  Net   -235 ml    Last BM: 12/14  Labs:   Recent Labs Lab 11/08/13 0538 11/09/13 0440 11/10/13 0615  NA 138 138 143  K 3.8 3.7 4.2  CL 99 97 101  CO2 29 28 30   BUN 17 21 26*  CREATININE 0.59 0.62 0.72   CALCIUM 8.7 8.7 9.1  GLUCOSE 122* 122* 127*    CBG (last 3)  No results found for this basename: GLUCAP,  in the last 72 hours  Scheduled Meds: . amLODipine  5 mg Oral Daily  . atenolol  50 mg Oral Daily  . digoxin  0.125 mg Oral q morning - 10a  . feeding supplement (ENSURE COMPLETE)  237 mL Oral BID BM  . hydrALAZINE  25 mg Oral Q8H  . levothyroxine  125 mcg Oral QAC breakfast  . metoCLOPramide  5 mg Oral TID AC & HS  . mupirocin ointment  1 application Nasal BID  . ondansetron (ZOFRAN) IV  4 mg Intravenous Once  . polyethylene glycol  17 g Oral Daily  . sodium chloride  3 mL Intravenous Q12H  . warfarin  2 mg Oral ONCE-1800  . Warfarin - Pharmacist Dosing Inpatient   Does not apply q1800    Continuous Infusions:   Jarold Motto MS, RD, LDN Pager: [phone] After-hours pager: [phone]

## 2013-11-10 NOTE — Progress Notes (Signed)
ANTICOAGULATION CONSULT NOTE - Follow Up Consult  Pharmacy Consult for Coumadin Indication: atrial fibrillation  Allergies  Allergen Reactions  . Erythromycin Rash  . Penicillins Rash  . Tetanus Toxoids Rash    Patient Measurements: Height: 5\' 5"  (165.1 cm) Weight: 187 lb 9.6 oz (85.095 kg) IBW/kg (Calculated) : 57 Heparin Dosing Weight:   Vital Signs: Temp: 96.2 F (35.7 C) (12/15 0505) Temp src: Axillary (12/15 0505) BP: 155/79 mmHg (12/15 0505) Pulse Rate: 88 (12/15 0505)  Labs:  Recent Labs  11/08/13 0538 11/09/13 0440 11/10/13 0615  HGB 13.1  --   --   HCT 37.6  --   --   PLT 307  --   --   LABPROT 35.2* 34.5* 28.1*  INR 3.69* 3.59* 2.74*  CREATININE 0.59 0.62 0.72    Estimated Creatinine Clearance: 54.3 ml/min (by C-G formula based on Cr of 0.72).   Medications:  Scheduled:  . amLODipine  5 mg Oral Daily  . atenolol  50 mg Oral Daily  . digoxin  0.125 mg Oral q morning - 10a  . feeding supplement (ENSURE COMPLETE)  237 mL Oral BID BM  . hydrALAZINE  25 mg Oral Q8H  . levothyroxine  125 mcg Oral QAC breakfast  . metoCLOPramide  5 mg Oral TID AC & HS  . mupirocin ointment  1 application Nasal BID  . ondansetron (ZOFRAN) IV  4 mg Intravenous Once  . polyethylene glycol  17 g Oral Daily  . sodium chloride  3 mL Intravenous Q12H  . Warfarin - Pharmacist Dosing Inpatient   Does not apply q1800    Assessment: 79yo female on Coumadin for AFib.  INR had been supratherapeutic for four days.  Today INR is therapeutic at 2.74 with holding coumadin for 4 days.  Patient was also on Cipro and Flagyl, but has completed the course.  Hg and Pltc wnl on 12/13.  No bleeding problems noted.  Goal of Therapy:  INR 2-3 Monitor platelets by anticoagulation protocol: Yes   Plan:  Coumadin 2 mg today F/U INR and H/H/Plt in AM  Anabel Bene, PharmD Clinical Pharmacist Resident Pager: [phone]

## 2013-11-10 NOTE — Clinical Social Work Note (Signed)
CSW advised during morning progression that patient ready for d/c. CSW informed later by MD that patient not yet medically stable. Genia Harold, admissions director of [facility] contacted and advised and she will advise husband as he was at her office.  Genelle Bal, MSW, LCSW [phone]

## 2013-11-11 ENCOUNTER — Encounter: Payer: Self-pay | Admitting: Nurse Practitioner

## 2013-11-11 ENCOUNTER — Non-Acute Institutional Stay (SKILLED_NURSING_FACILITY): Payer: Medicare Other | Admitting: Nurse Practitioner

## 2013-11-11 DIAGNOSIS — G609 Hereditary and idiopathic neuropathy, unspecified: Secondary | ICD-10-CM

## 2013-11-11 DIAGNOSIS — Z5181 Encounter for therapeutic drug level monitoring: Secondary | ICD-10-CM

## 2013-11-11 DIAGNOSIS — B0229 Other postherpetic nervous system involvement: Secondary | ICD-10-CM

## 2013-11-11 DIAGNOSIS — R413 Other amnesia: Secondary | ICD-10-CM

## 2013-11-11 DIAGNOSIS — K5289 Other specified noninfective gastroenteritis and colitis: Secondary | ICD-10-CM

## 2013-11-11 DIAGNOSIS — B029 Zoster without complications: Secondary | ICD-10-CM

## 2013-11-11 DIAGNOSIS — I509 Heart failure, unspecified: Secondary | ICD-10-CM

## 2013-11-11 DIAGNOSIS — I4891 Unspecified atrial fibrillation: Secondary | ICD-10-CM

## 2013-11-11 DIAGNOSIS — I10 Essential (primary) hypertension: Secondary | ICD-10-CM

## 2013-11-11 DIAGNOSIS — Z7901 Long term (current) use of anticoagulants: Secondary | ICD-10-CM

## 2013-11-11 DIAGNOSIS — E871 Hypo-osmolality and hyponatremia: Secondary | ICD-10-CM

## 2013-11-11 DIAGNOSIS — G629 Polyneuropathy, unspecified: Secondary | ICD-10-CM

## 2013-11-11 DIAGNOSIS — K6389 Other specified diseases of intestine: Secondary | ICD-10-CM

## 2013-11-11 DIAGNOSIS — K529 Noninfective gastroenteritis and colitis, unspecified: Secondary | ICD-10-CM

## 2013-11-11 DIAGNOSIS — E039 Hypothyroidism, unspecified: Secondary | ICD-10-CM

## 2013-11-11 DIAGNOSIS — D638 Anemia in other chronic diseases classified elsewhere: Secondary | ICD-10-CM

## 2013-11-11 LAB — PROTIME-INR
INR: 2.64 — ABNORMAL HIGH (ref 0.00–1.49)
Prothrombin Time: 27.3 seconds — ABNORMAL HIGH (ref 11.6–15.2)

## 2013-11-11 MED ORDER — WARFARIN SODIUM 2 MG PO TABS
2.0000 mg | ORAL_TABLET | Freq: Once | ORAL | Status: DC
Start: 2013-11-11 — End: 2013-11-11
  Filled 2013-11-11: qty 1

## 2013-11-11 NOTE — Progress Notes (Signed)
ANTICOAGULATION CONSULT NOTE - Follow Up Consult  Pharmacy Consult for Coumadin Indication: atrial fibrillation  Allergies  Allergen Reactions  . Erythromycin Rash  . Penicillins Rash  . Tetanus Toxoids Rash    Patient Measurements: Height: 5\' 5"  (165.1 cm) Weight: 187 lb 9.6 oz (85.095 kg) IBW/kg (Calculated) : 57 Heparin Dosing Weight:   Vital Signs: Temp: 98 F (36.7 C) (12/16 0507) Temp src: Oral (12/16 0507) BP: 151/81 mmHg (12/16 0507) Pulse Rate: 95 (12/16 0507)  Labs:  Recent Labs  11/09/13 0440 11/10/13 0615 11/11/13 0602  LABPROT 34.5* 28.1* 27.3*  INR 3.59* 2.74* 2.64*  CREATININE 0.62 0.72  --     Estimated Creatinine Clearance: 54.3 ml/min (by C-G formula based on Cr of 0.72).   Medications:  Scheduled:  . amLODipine  5 mg Oral Daily  . atenolol  50 mg Oral Daily  . digoxin  0.125 mg Oral q morning - 10a  . feeding supplement (ENSURE COMPLETE)  237 mL Oral BID BM  . hydrALAZINE  25 mg Oral Q8H  . levothyroxine  125 mcg Oral QAC breakfast  . metoCLOPramide  5 mg Oral TID AC & HS  . ondansetron (ZOFRAN) IV  4 mg Intravenous Once  . polyethylene glycol  17 g Oral Daily  . sodium chloride  3 mL Intravenous Q12H  . Warfarin - Pharmacist Dosing Inpatient   Does not apply q1800    Assessment: 79 yo female on Coumadin for AFib. INR was supratherapeutic for four days and Coumadin was held. On 12/15, INR was therapeutic at 2.74 and a 2mg  dose was given. Patient was previously on ciprofloxacin and flagyl. INR 2.64 on 12/16. Hemoglobin and platelets were normal on 12/13. No bleeding problems noted.  Goal of Therapy:  INR 2-3 Monitor platelets by anticoagulation protocol: Yes   Plan:  - Coumadin 2mg  PO x 1 today - F/U INR, H/H, platelets, and S/S bleeding  Charlyne Robertshaw A. Lenon Ahmadi, PharmD Clinical Pharmacist - Resident Pager: [phone] Pharmacy: [phone] 11/11/2013 9:26 AM

## 2013-11-11 NOTE — Progress Notes (Signed)
Patient ID: Shirley Mccormick, female   DOB: Jan 09, 1925, 79 y.o.   MRN: [id number]   Code Status: DNR  Allergies  Allergen Reactions  . Erythromycin Rash  . Penicillins Rash  . Tetanus Toxoids Rash    Chief Complaint  Patient presents with  . Medical Managment of Chronic Issues    abd pain and extension.   . Acute Visit  . Hospitalization Follow-up    HPI: Patient is a 79 y.o. female seen in the SNF at [facility] today for evaluation of distended abd and abd pain and other chronic medical conditions. Hospitalized 10/31/2013 -11/11/2013. She has history of A. fib on Coumadin, hypothyroidism, peripheral neuropathy, history of recent shingles. The patient presented to ED with 2 episodes of fall which was thought to be mechanical. She also had projectile vomiting on the day of admission. Head CT was unremarkable. Patient also found to have abdominal distention with CT scan of the abdomen and pelvis showing wall thickening along the mid sigmoid colon concerning for infectious versus inflammatory process. Problem List Items Addressed This Visit   Herpes zoster     Skin eruption has healed at the left flank and lateral chest.     Hyponatremia     No NSAIDs or nephrotoxic meds used.Serum osmolality was low. normal urine sodium and urine osmolality. Renal recommendation continue fluid restriction and placed on regular diet.Her sodium is improving. Recommendations to stop the sodium tabs and lasix as per renal. TSH and uric acid normal . Update BMP     A-fib     Rate controlled on Digoxin 0.125mg  and Atenolol 50mg  daily, Dig level 0.3 10/02/13              Anticoagulation goal of INR 2 to 3     For AFib    Unspecified hypothyroidism     Adequately supplemented with Levothyroxine , TSH 3.573 10/02/13              Peripheral neuropathy     Chronic and longstanding needle pricking pain in feet. Takes Tylenol routinely at home. Hx of Guillain Barre.            Postherpetic neuralgia     Started Gabapentin 100mg  bid since 10/14/13 for pain--helped but not adequate--better controlled with Gabapentin 100mg  tid and able to decrease Hydrocodone to bid prn.           Memory impairment     MMSE 20/30 10/2013      Anemia, chronic disease     Hgb 13s while in hospital.       CHF (congestive heart failure)     Compensated clinically.     Noninfectious gastroenteritis and colitis     C-diff negative. Loose stools.     Accelerated hypertension     Controlled.     Colon distention - Primary     Distended abd and pain when palpated. Rectal tube and side to side repositioning frequently. Pain is to be managed with Tylenol and occasional Norco since Narcotics can worsen distended colons. Serial KUB as needed. Patient does have abdominal distention with CT findings of infectious versus inflammation of admission.Marland Kitchen on empiric Cipro and Flagyl, completed 5 days of antibiotics. She continues to have loose BM. C DIFF pcr negative. Vomiting has resolved. Abdominal x-ray repeated on 12/8 shows distended bowel gas possibly ileus,. No obstruction seen. Surgery consulted recommended ambulation, replete K and prokinetic agents.  -she had a CT abd showed improvement in the colon distension.  Review of Systems:  Review of Systems  Constitutional: Positive for malaise/fatigue. Negative for fever, chills, weight loss and diaphoresis.       Generalized and lower body weakness.   HENT: Positive for hearing loss. Negative for congestion, ear discharge, ear pain, nosebleeds, sore throat and tinnitus.   Eyes: Negative for blurred vision, double vision, photophobia, pain, discharge and redness.  Respiratory: Negative for cough, hemoptysis, sputum production, shortness of breath, wheezing and stridor.   Cardiovascular: Positive for leg swelling. Negative for chest pain, palpitations, orthopnea, claudication and PND.       BLE  Gastrointestinal: Negative for  heartburn, nausea, vomiting, abdominal pain, diarrhea, constipation, blood in stool and melena.       Distended abd and pain when palpated.   Genitourinary: Positive for frequency. Negative for dysuria, urgency, hematuria and flank pain.  Musculoskeletal: Positive for back pain. Negative for falls, joint pain, myalgias and neck pain.       From her left shingle site travels to her left back.   Skin: Positive for rash.       Left flank and abd shingle lesions healing nicely. Bruise at the left forehead into her hairline.   Neurological: Positive for sensory change and weakness. Negative for dizziness, tingling, tremors, speech change, focal weakness, seizures, loss of consciousness and headaches.       BLE needle pricking. Left flank postherpetic neuralgia.   Endo/Heme/Allergies: Negative for environmental allergies and polydipsia. Bruises/bleeds easily.  Psychiatric/Behavioral: Positive for memory loss. Negative for depression, suicidal ideas, hallucinations and substance abuse. The patient is not nervous/anxious and does not have insomnia.        Mild confusion occasionally.      Past Medical History  Diagnosis Date  . Afib   . Shingles 10/01/13  . Pulmonary embolism 1990  . Guillain-Barre 1990    resolved  . Unspecified hypothyroidism   . Neuropathy    Past Surgical History  Procedure Laterality Date  . Breast lumpectomy Right 1965    Dr. Judithe Modest   Social History:   reports that she has never smoked. She does not have any smokeless tobacco history on file. She reports that she does not drink alcohol or use illicit drugs.  Medications: Patient's Medications  New Prescriptions   No medications on file  Previous Medications   ACETAMINOPHEN (TYLENOL) 325 MG TABLET    Take 650 mg by mouth every 6 (six) hours as needed for mild pain.   AMLODIPINE (NORVASC) 5 MG TABLET    Take 1 tablet (5 mg total) by mouth daily.   ATENOLOL (TENORMIN) 50 MG TABLET    Take 50 mg by mouth daily.     CHOLECALCIFEROL (VITAMIN D) 1000 UNITS TABLET    Take 1,000 Units by mouth every morning.    DIGOXIN (LANOXIN) 0.125 MG TABLET    Take 0.125 mg by mouth every morning.    DIPHENHYDRAMINE-ACETAMINOPHEN (TYLENOL PM) 25-500 MG TABS    Take 2 tablets by mouth at bedtime.   FEEDING SUPPLEMENT, ENSURE COMPLETE, (ENSURE COMPLETE) LIQD    Take 237 mLs by mouth 2 (two) times daily between meals.   FISH OIL-OMEGA-3 FATTY ACIDS 1000 MG CAPSULE    Take 1 g by mouth 2 (two) times daily.    GABAPENTIN (NEURONTIN) 100 MG CAPSULE    Take 100 mg by mouth 3 (three) times daily.   GLUCOSAMINE-CHONDROITIN 500-400 MG TABLET    Take 1 tablet by mouth 3 (three) times daily.   HYDRALAZINE (APRESOLINE)  25 MG TABLET    Take 1 tablet (25 mg total) by mouth every 8 (eight) hours.   IPRATROPIUM-ALBUTEROL (DUONEB) 0.5-2.5 (3) MG/3ML SOLN    Take 3 mLs by nebulization 3 (three) times daily. For 3 days only. START 12.3.14 END 12.5.14   LEVOTHYROXINE (SYNTHROID, LEVOTHROID) 125 MCG TABLET    Take 125 mcg by mouth daily.   MULTIPLE VITAMINS-MINERALS (CERTAVITE/ANTIOXIDANTS PO)    Take 1 tablet by mouth every morning.   WARFARIN (COUMADIN) 1 MG TABLET    Take 7 mg by mouth daily at 6 PM.   WARFARIN (COUMADIN) 6 MG TABLET    Take 6 mg by mouth daily at 6 PM.  Modified Medications   No medications on file  Discontinued Medications   No medications on file     Physical Exam: Physical Exam  Constitutional: She is oriented to person, place, and time. She appears well-developed and well-nourished. No distress.  HENT:  Head: Normocephalic and atraumatic.  Right Ear: External ear normal.  Left Ear: External ear normal.  Nose: Nose normal.  Mouth/Throat: Oropharynx is clear and moist. No oropharyngeal exudate.  Eyes: Conjunctivae and EOM are normal. Pupils are equal, round, and reactive to light. Right eye exhibits no discharge. Left eye exhibits no discharge. No scleral icterus.  Neck: Normal range of motion. Neck supple. No  JVD present. No tracheal deviation present. No thyromegaly present.  Cardiovascular: Normal rate, regular rhythm, normal heart sounds and intact distal pulses.   No murmur heard. Pulmonary/Chest: Effort normal and breath sounds normal. No stridor. No respiratory distress. She has no wheezes. She has no rales.  Abdominal: Soft. She exhibits distension. Bowel sounds are increased. There is tenderness. There is no rebound and no guarding.  Musculoskeletal: Normal range of motion. She exhibits edema. She exhibits no tenderness.  Unable to stand on her own. BLE edema 2+  Lymphadenopathy:    She has no cervical adenopathy.  Neurological: She is alert and oriented to person, place, and time. She has normal reflexes. No cranial nerve deficit. She exhibits normal muscle tone. Coordination normal.  Skin: Skin is warm and dry. Rash noted. She is not diaphoretic. There is erythema.  Shingles left flank and abd-healing nicely.   Psychiatric: She has a normal mood and affect. Her behavior is normal. Judgment and thought content normal.    Filed Vitals:   11/12/13 2013  BP: 120/60  Pulse: 90  Temp: 98 F (36.7 C)  TempSrc: Tympanic  Resp: 18      Labs reviewed: Basic Metabolic Panel:  Recent Labs  95/62/13 0522 11/01/13 1610  11/08/13 0538 11/09/13 0440 11/10/13 0615  NA 121*  --   < > 138 138 143  K 3.6  --   < > 3.8 3.7 4.2  CL 83*  --   < > 99 97 101  CO2 23  --   < > 29 28 30   GLUCOSE 115*  --   < > 122* 122* 127*  BUN 10  --   < > 17 21 26*  CREATININE 0.54  --   < > 0.59 0.62 0.72  CALCIUM 8.7  --   < > 8.7 8.7 9.1  TSH  --  3.021  --   --   --   --   < > = values in this interval not displayed. Liver Function Tests:  Recent Labs  10/01/13 1301 11/01/13 0522  AST 31 23  ALT 21 13  ALKPHOS 47 42  BILITOT 0.4 0.7  PROT 7.6 6.8  ALBUMIN 3.7 3.1*   CBC:  Recent Labs  10/01/13 1301  10/31/13 1845  11/01/13 0522 11/06/13 0526 11/08/13 0538  WBC 5.2  < > 7.5  --   8.0 9.8 11.6*  NEUTROABS 3.0  --  5.2  --   --   --   --   HGB 13.5  < > 12.4  < > 12.6 11.9* 13.1  HCT 39.3  < > 35.0*  < > 35.7* 34.4* 37.6  MCV 87.5  --  88.2  --  88.4 87.8 90.2  PLT 187  < > 222  --  197 254 307  < > = values in this interval not displayed.  Past Procedures:  Ct Head Wo Contrast  10/31/2013 CLINICAL DATA: Projectile vomiting this morning. Fell 3 days ago. EXAM: CT HEAD WITHOUT CONTRAST CT CERVICAL SPINE WITHOUT CONTRAST: IMPRESSION: 1. Acute left sphenoid sinusitis vs. blood in the sinus. 2. No skull fracture or intracranial hemorrhage seen. 3. No cervical spine fracture or traumatic subluxation. 4. Multilevel cervical spine degenerative changes. 5. Atrophy and chronic small vessel white matter ischemic changes in both cerebral hemispheres.   Ct Cervical Spine Wo Contrast  10/31/2013 CLINICAL DATA: Projectile vomiting this morning. Fell 3 days ago. EXAM: CT HEAD WITHOUT CONTRAST CT CERVICAL SPINE WITHOUT CONTRAST IMPRESSION: 1. Acute left sphenoid sinusitis vs. blood in the sinus. 2. No skull fracture or intracranial hemorrhage seen. 3. No cervical spine fracture or traumatic subluxation. 4. Multilevel cervical spine degenerative changes. 5. Atrophy and chronic small vessel white matter ischemic changes in both cerebral hemispheres.   Ct Abdomen Pelvis W Contrast  11/10/2013 CLINICAL DATA: Followup to abdominal CT performed on 10/31/2013 showing wall thickening of the mid sigmoid colon. EXAM: CT ABDOMEN AND PELVIS WITH CONTRAST IMPRESSION: 1. There is now no evidence of colonic inflammation. There is no bowel wall thickening. The appearance on the current exam is consistent with a diffuse colonic adynamic ileus with mild distention of the colon and rectum and air-fluid levels. 2. No obstruction. The stomach is decompressed and the small bowel normal in caliber. 3. Cardiomegaly, lung base scarring/subsegmental atelectasis and small left effusion, stable from the prior study. 4.  Chronic findings in the abdomen and pelvis as detailed above all stable.   Ct Abdomen Pelvis W Contrast  10/31/2013 CLINICAL DATA: Abdominal distention, vomiting and fall. Altered mental status. EXAM: CT ABDOMEN AND PELVIS WITH CONTRAST IMPRESSION: 1. Intermittent distention of the colon, still within normal limits in caliber, with fluid filling the mid to distal sigmoid colon and rectum. Apparent mild wall thickening along the mid sigmoid colon may reflect a mild infectious or inflammatory process. 2. Trace left pleural effusion, with mild bilateral peripheral scarring. 3. Diffuse calcification along the abdominal aorta and its branches, with mild thrombus along the distal descending thoracic aorta. 4. Nonspecific diffuse presacral stranding noted. 5. Left convex lumbar scoliosis noted.   Dg Abd 2 Views  11/08/2013 CLINICAL DATA: Abdominal distention EXAM: ABDOMEN - 2 VIEW IMPRESSION: Changes most consistent with an ileus. The overall appearance is stable from the previous exam.   Dg Abd 2 Views  11/07/2013 CLINICAL DATA: Abdominal distention and pain EXAM: ABDOMEN - 2 VIIEWS IMPRESSION: Findings again likely reflecting an ileus. There does appear to be slight decompression.   Dg Abd 2 Views  11/06/2013 CLINICAL DATA: Distended abdomen. EXAM: ABDOMEN - 2 VIEW IMPRESSION: Gas distended colon measuring up to 9.9 cm. This may represent an ileus  related to inflammation although distal obstructing lesion not excluded. No free intraperitoneal air.   Dg Abd Acute W/chest  10/31/2013 CLINICAL DATA: Distended abdomen. EXAM: ACUTE ABDOMEN SERIES (ABDOMEN 2 VIEW & CHEST 1 VIEW) COMPARISON:  IMPRESSION: 1. Moderate distension of the large bowel loops identified. Findings may reflect colonic ileus or distal bowel obstruction. Consider further assessment with CT of the abdomen and pelvis with oral contrast material. 2. Suspect mild CHF.   Dg Abd Portable 1v  11/03/2013 CLINICAL DATA: Abdominal distension question  obstruction EXAM: PORTABLE ABDOMEN IMPRESSION: Nonspecific bowel gas pattern with air-filled loops of large and small bowel throughout abdomen. No definite evidence of obstruction or wall thickening.   Assessment/Plan Colon distention Distended abd and pain when palpated. Rectal tube and side to side repositioning frequently. Pain is to be managed with Tylenol and occasional Norco since Narcotics can worsen distended colons. Serial KUB as needed. Patient does have abdominal distention with CT findings of infectious versus inflammation of admission.Marland Kitchen on empiric Cipro and Flagyl, completed 5 days of antibiotics. She continues to have loose BM. C DIFF pcr negative. Vomiting has resolved. Abdominal x-ray repeated on 12/8 shows distended bowel gas possibly ileus,. No obstruction seen. Surgery consulted recommended ambulation, replete K and prokinetic agents.  -she had a CT abd showed improvement in the colon distension.    Hyponatremia No NSAIDs or nephrotoxic meds used.Serum osmolality was low. normal urine sodium and urine osmolality. Renal recommendation continue fluid restriction and placed on regular diet.Her sodium is improving. Recommendations to stop the sodium tabs and lasix as per renal. TSH and uric acid normal . Update BMP   Accelerated hypertension Controlled.   Noninfectious gastroenteritis and colitis C-diff negative. Loose stools.   CHF (congestive heart failure) Compensated clinically.   Anemia, chronic disease Hgb 13s while in hospital.     Memory impairment MMSE 20/30 10/2013    Postherpetic neuralgia Started Gabapentin 100mg  bid since 10/14/13 for pain--helped but not adequate--better controlled with Gabapentin 100mg  tid and able to decrease Hydrocodone to bid prn.         Peripheral neuropathy Chronic and longstanding needle pricking pain in feet. Takes Tylenol routinely at home. Hx of Guillain Barre.          Unspecified hypothyroidism Adequately  supplemented with Levothyroxine , TSH 3.573 10/02/13            Anticoagulation goal of INR 2 to 3 For AFib  A-fib Rate controlled on Digoxin 0.125mg  and Atenolol 50mg  daily, Dig level 0.3 10/02/13            Herpes zoster Skin eruption has healed at the left flank and lateral chest.     Family/ Staff Communication: observe the patient.   Goals of Care: SNF  Labs/tests ordered: BMP

## 2013-11-11 NOTE — Progress Notes (Signed)
Physical Therapy Treatment Patient Details Name: Shirley Mccormick MRN: [id number] DOB: Jul 14, 1925 Today's Date: 11/11/2013 Time: 2841-3244 PT Time Calculation (min): 17 min  PT Assessment / Plan / Recommendation  History of Present Illness Shirley Mccormick is a 79 y.o. female with Past medical history of atrial fibrillation, hypothyroidism, neuropathy.  Admitted s/p fall 12/2.She sustained an injury on her for after the fall but as per the nursing documentation she did not have any other injury nor she had any significant change in her mentation. The follow thought to be a mechanical fall. At the time of my evaluation the patient appears poor historian due to her baseline dementia.   PT Comments   Pt. With limited participation and limited progression towards goals.  Anticipate pt. May have been non-ambulatory prior to admission.  Follow Up Recommendations  SNF     Does the patient have the potential to tolerate intense rehabilitation     Barriers to Discharge        Equipment Recommendations  None recommended by PT    Recommendations for Other Services OT consult  Frequency Min 2X/week   Progress towards PT Goals Progress towards PT goals: Not progressing toward goals - comment (2/2 limited participation)  Plan Current plan remains appropriate    Precautions / Restrictions Precautions Precautions: Fall Restrictions Weight Bearing Restrictions: No   Pertinent Vitals/Pain Pt. Unable to rate pain however facial grimace made with movement of bil. Lower extremities.    Mobility  Bed Mobility Bed Mobility: Supine to Sit;Sitting - Scoot to Edge of Bed Supine to Sit: 1: +2 Total assist;HOB elevated Supine to Sit: Patient Percentage: 10% Sitting - Scoot to Edge of Bed: 1: +2 Total assist Sitting - Scoot to Edge of Bed: Patient Percentage: 0% Details for Bed Mobility Assistance: pt. with limited participation needing max cues.  Pt. not following commands. Transfers Transfers:  Lateral/Scoot Transfers Lateral/Scoot Transfers: 1: +2 Total assist;With armrests removed Lateral Transfers: Patient Percentage: 0% Details for Transfer Assistance: pt. unable to participate in transfer. Ambulation/Gait Ambulation/Gait Assistance: Not tested (comment) (anticipate non-ambulatory PTA) Wheelchair Mobility Wheelchair Mobility: No    Exercises     PT Diagnosis:    PT Problem List:   PT Treatment Interventions:     PT Goals (current goals can now be found in the care plan section) Acute Rehab PT Goals Patient Stated Goal: none stated PT Goal Formulation: With patient Time For Goal Achievement: 11/17/13 Potential to Achieve Goals: Fair  Visit Information  Last PT Received On: 11/11/13 Assistance Needed: +2 History of Present Illness: Shirley Mccormick is a 79 y.o. female with Past medical history of atrial fibrillation, hypothyroidism, neuropathy.  Admitted s/p fall 12/2.She sustained an injury on her for after the fall but as per the nursing documentation she did not have any other injury nor she had any significant change in her mentation. The follow thought to be a mechanical fall. At the time of my evaluation the patient appears poor historian due to her baseline dementia.    Subjective Data  Subjective: pt. with limited response to PT. Patient Stated Goal: none stated   Cognition  Cognition Arousal/Alertness: Lethargic Behavior During Therapy: Flat affect Overall Cognitive Status: No family/caregiver present to determine baseline cognitive functioning Memory: Decreased short-term memory    Balance  Balance Balance Assessed: Yes Static Sitting Balance Static Sitting - Balance Support: Right upper extremity supported;Feet supported Static Sitting - Level of Assistance: 2: Max assist;4: Min assist (varying degrees of support with  occasional episodes of min A) Static Sitting - Comment/# of Minutes: 8  End of Session PT - End of Session Equipment Utilized During  Treatment: Gait belt Activity Tolerance: Patient limited by fatigue Patient left: in chair;with call bell/phone within reach Nurse Communication: Mobility status;Need for lift equipment   GP     Ernestene Mention 11/11/2013, 9:15 AM  Clarita Crane, PT, DPT [phone]

## 2013-11-11 NOTE — Discharge Summary (Addendum)
Physician Discharge Summary  SANAH KRASKA NWG:[id number] DOB: 1925-05-09 DOA: 10/31/2013  PCP: Gwen Pounds, MD  Admit date: 10/31/2013 Discharge date: 11/11/2013  Time spent: 35 minutes  Recommendations for Outpatient Follow-up:  1. Follow up with PCP IN ONE WEEK 2. Check sodium with bMP  In 1 to 2 days.  3. Follow with serial abdominal films if, abdomen gets distended.   Discharge Diagnoses:  Principal Problem:   Cellulitis Active Problems:   Hyponatremia   A-fib   Postherpetic neuralgia   Fall at nursing home   Projectile vomiting   Other and unspecified noninfectious gastroenteritis and colitis(558.9)   CHF (congestive heart failure)   Hypokalemia   Noninfectious gastroenteritis and colitis   Accelerated hypertension   Colon distention   Discharge Condition: improved  Diet recommendation: regular diet  Filed Weights   11/08/13 2102 11/09/13 2202 11/10/13 2036  Weight: 90.175 kg (198 lb 12.8 oz) 85.095 kg (187 lb 9.6 oz) 85.095 kg (187 lb 9.6 oz)    History of present illness:  79 year old female with history of A. fib on Coumadin, hypothyroidism, peripheral neuropathy, history of recent shingles, who was sent from nursing home with 2 episodes of fall which was thought to be mechanical. She also started having projectile vomiting on the day of admission. Head CT was unremarkable. Initially a cervical collar was applied and he woke on the floor today and a CT of the cervical spine was unremarkable for any acute injury. Patient also found to have abdominal distention with CT scan of the abdomen and pelvis showing wall thickening along the mid sigmoid colon concerning for infectious versus inflammatory process. Patient also found to have severe hyponatremia.    Hospital Course:  Hyponatremia  - resolved.  - Sodium recently was within normal limits. No NSAIDs or nephrotoxic meds used. Did not improve with IV hydration which was tried initially. Possibly SIADH. Placed  on fluid restriction without much improvement. ( for past 72 hrs) .Serum osmolality was low. normal urine sodium and urine osmolality .  -Appreciated renal recommendation. . Recommend to continue fluid restriction and placed on regular diet.Pt currently on sodium tablets in addition to fluid restriction and lasix,. Her sodium is improving. Recommendations to stop the sodium tabs and lasix as per renal.  -holding indapamide and spironolactone.  -TSH and uric acid normal .  possible abdominal colitis  Patient does have abdominal distention with CT findings of infectious versus inflammation of admission.Marland Kitchen on empiric Cipro and Flagyl, completed 5 days of antibiotics. She continues to have loose BM. C DIFF pcr negative. Vomiting has resolved. Abdominal x-ray repeated on 12/8 shows distended bowel gas possibly ileus,. No obstruction seen. Surgery consulted recommended ambulation, replete K and prokinetic agents.  -she had a CT abd showed improvement in the colon distension. She will be discharged to SNF today. Discussed with Dr Elnoria Howard curb side.  Hypokalemia  replenish with KCl and repeat in am.  Postherpetic neuralgia  Hold off Pain medication at this time given her confusion.  A. fib  Rate controlled. Coumadin per pharmacy.  Cardiomyopathy  . Hold indapamide and Aldactone for now given hyponatremia. Continue digoxin. Continue atenolol  2-D echo done.  Hypothyroidism.  Continue Synthroid. Normal TSH  Dementia  Recent MMSE of 20      Procedures:  CT abd and pelvis  Consultations:  Surgery  renal  Discharge Exam: Filed Vitals:   11/11/13 0944  BP: 148/78  Pulse: 92  Temp: 98 F (36.7 C)  Resp: 18  General: Elderly female lying in bed in no acute distress  HEENT: No pallor, no icterus, moist oral mucosa  Chest: Clear to auscultation bilaterally, no added sounds  CVS: Normal S1 and S2, no murmurs rub or gallop  Abdomen: moderately distended, tympanic note to percussion,  nontender, bowel sounds present  Extremities: trace edema bilaterally,  CNS: AAO x2, no focal deficit   Discharge Instructions      Discharge Orders   Future Orders Complete By Expires   Discharge instructions  As directed    Comments:     Follow up with PCP IN ONE WEEK.       Medication List    STOP taking these medications       HYDROcodone-acetaminophen 5-325 MG per tablet  Commonly known as:  NORCO/VICODIN     hydrOXYzine 25 MG tablet  Commonly known as:  ATARAX/VISTARIL     indapamide 2.5 MG tablet  Commonly known as:  LOZOL     potassium chloride 10 MEQ tablet  Commonly known as:  K-DUR     spironolactone 25 MG tablet  Commonly known as:  ALDACTONE      TAKE these medications       acetaminophen 325 MG tablet  Commonly known as:  TYLENOL  Take 650 mg by mouth every 6 (six) hours as needed for mild pain.     amLODipine 5 MG tablet  Commonly known as:  NORVASC  Take 1 tablet (5 mg total) by mouth daily.     atenolol 50 MG tablet  Commonly known as:  TENORMIN  Take 50 mg by mouth daily.     CERTAVITE/ANTIOXIDANTS PO  Take 1 tablet by mouth every morning.     cholecalciferol 1000 UNITS tablet  Commonly known as:  VITAMIN D  Take 1,000 Units by mouth every morning.     digoxin 0.125 MG tablet  Commonly known as:  LANOXIN  Take 0.125 mg by mouth every morning.     diphenhydramine-acetaminophen 25-500 MG Tabs  Commonly known as:  TYLENOL PM  Take 2 tablets by mouth at bedtime.     feeding supplement (ENSURE COMPLETE) Liqd  Take 237 mLs by mouth 2 (two) times daily between meals.     fish oil-omega-3 fatty acids 1000 MG capsule  Take 1 g by mouth 2 (two) times daily.     gabapentin 100 MG capsule  Commonly known as:  NEURONTIN  Take 100 mg by mouth 3 (three) times daily.     glucosamine-chondroitin 500-400 MG tablet  Take 1 tablet by mouth 3 (three) times daily.     hydrALAZINE 25 MG tablet  Commonly known as:  APRESOLINE  Take 1 tablet  (25 mg total) by mouth every 8 (eight) hours.     ipratropium-albuterol 0.5-2.5 (3) MG/3ML Soln  Commonly known as:  DUONEB  Take 3 mLs by nebulization 3 (three) times daily. For 3 days only. START 12.3.14 END 12.5.14     levothyroxine 125 MCG tablet  Commonly known as:  SYNTHROID, LEVOTHROID  Take 125 mcg by mouth daily.     warfarin 6 MG tablet  Commonly known as:  COUMADIN  Take 6 mg by mouth daily at 6 PM.     warfarin 1 MG tablet  Commonly known as:  COUMADIN  Take 7 mg by mouth daily at 6 PM.       Allergies  Allergen Reactions  . Erythromycin Rash  . Penicillins Rash  . Tetanus Toxoids Rash   Follow-up  Information   Follow up with Gwen Pounds, MD.   Specialty:  Internal Medicine   Contact information:   [phone] [facility] [place] [address] [phone]       Follow up with Gwen Pounds, MD. Schedule an appointment as soon as possible for a visit in 1 week.   Specialty:  Internal Medicine   Contact information:   [address] [facility] [place] [address] [phone]        The results of significant diagnostics from this hospitalization (including imaging, microbiology, ancillary and laboratory) are listed below for reference.    Significant Diagnostic Studies: Ct Head Wo Contrast  10/31/2013   CLINICAL DATA:  Projectile vomiting this morning.  Fell 3 days ago.  EXAM: CT HEAD WITHOUT CONTRAST  CT CERVICAL SPINE WITHOUT CONTRAST  TECHNIQUE: Multidetector CT imaging of the head and cervical spine was performed following the standard protocol without intravenous contrast. Multiplanar CT image reconstructions of the cervical spine were also generated.  COMPARISON:  None.  FINDINGS: CT HEAD FINDINGS  Diffusely enlarged ventricles and subarachnoid spaces. Sphenoid sinus air-fluid level. No skull fracture or intracranial hemorrhage seen.  CT CERVICAL SPINE FINDINGS  Multilevel degenerative changes.  These include facet degenerative changes at multiple levels, most pronounced on the left at the C3-4 level, with associated 3 mm of anterolisthesis at that level. No prevertebral soft tissue swelling or fractures seen. Biapical parenchymal scarring with calcification. Previously noted sphenoid sinus air-fluid level. No skull base fracture visualized.  IMPRESSION: 1. Acute left sphenoid sinusitis vs. blood in the sinus. 2. No skull fracture or intracranial hemorrhage seen. 3. No cervical spine fracture or traumatic subluxation. 4. Multilevel cervical spine degenerative changes. 5. Atrophy and chronic small vessel white matter ischemic changes in both cerebral hemispheres.   Electronically Signed   By: Gordan Payment M.D.   On: 10/31/2013 20:24   Ct Cervical Spine Wo Contrast  10/31/2013   CLINICAL DATA:  Projectile vomiting this morning.  Fell 3 days ago.  EXAM: CT HEAD WITHOUT CONTRAST  CT CERVICAL SPINE WITHOUT CONTRAST  TECHNIQUE: Multidetector CT imaging of the head and cervical spine was performed following the standard protocol without intravenous contrast. Multiplanar CT image reconstructions of the cervical spine were also generated.  COMPARISON:  None.  FINDINGS: CT HEAD FINDINGS  Diffusely enlarged ventricles and subarachnoid spaces. Sphenoid sinus air-fluid level. No skull fracture or intracranial hemorrhage seen.  CT CERVICAL SPINE FINDINGS  Multilevel degenerative changes. These include facet degenerative changes at multiple levels, most pronounced on the left at the C3-4 level, with associated 3 mm of anterolisthesis at that level. No prevertebral soft tissue swelling or fractures seen. Biapical parenchymal scarring with calcification. Previously noted sphenoid sinus air-fluid level. No skull base fracture visualized.  IMPRESSION: 1. Acute left sphenoid sinusitis vs. blood in the sinus. 2. No skull fracture or intracranial hemorrhage seen. 3. No cervical spine fracture or traumatic subluxation. 4.  Multilevel cervical spine degenerative changes. 5. Atrophy and chronic small vessel white matter ischemic changes in both cerebral hemispheres.   Electronically Signed   By: Gordan Payment M.D.   On: 10/31/2013 20:24   Ct Abdomen Pelvis W Contrast  11/10/2013   CLINICAL DATA:  Followup to abdominal CT performed on 10/31/2013 showing wall thickening of the mid sigmoid colon.  EXAM: CT ABDOMEN AND PELVIS WITH CONTRAST  TECHNIQUE: Multidetector CT imaging of the abdomen and pelvis was performed using the standard protocol following bolus administration of  intravenous contrast.  CONTRAST:  OMNIPAQUE IOHEXOL 300 MG/ML  SOLN  COMPARISON:  Radiographs, 11/08/2013.  CT, 10/31/2013.  FINDINGS: The colon is now more diffusely distended. There are no areas of wall thickening. No colonic inflammatory change is seen. The colon is distended as is the rectum. There are air-fluid levels. There is no small bowel dilation. The stomach is mostly decompressed.  Stable lung base reticular scarring and subsegmental atelectasis. There are few tiny lung base nodules bilaterally. Small left pleural effusion. Stable cardiomegaly.  Liver and spleen are unremarkable. The gallbladder is mostly decompressed as it was previously. No bile duct dilation. The pancreas is unremarkable. No significant bile duct dilation. There is bilateral adrenal gland thickening likely due to hyperplasia. This is stable. Mild, right greater than left, renal cortical thinning is noted. No renal mass is seen. No hydronephrosis. The ureters and bladder are unremarkable.  There is calcification in uterus likely in a small fibroid. The uterus and adnexa are otherwise unremarkable.  No pathologically enlarged lymph nodes.  There is no ascites.  There are significant degenerative changes of the lumbar spine. The bones are diffusely demineralized. No osteoblastic or osteolytic lesions.  IMPRESSION: 1. There is now no evidence of colonic inflammation. There is no  bowel wall thickening. The appearance on the current exam is consistent with a diffuse colonic adynamic ileus with mild distention of the colon and rectum and air-fluid levels. 2. No obstruction. The stomach is decompressed and the small bowel normal in caliber. 3. Cardiomegaly, lung base scarring/subsegmental atelectasis and small left effusion, stable from the prior study. 4. Chronic findings in the abdomen and pelvis as detailed above all stable.   Electronically Signed   By: Amie Portland M.D.   On: 11/10/2013 16:44   Ct Abdomen Pelvis W Contrast  10/31/2013   CLINICAL DATA:  Abdominal distention, vomiting and fall. Altered mental status.  EXAM: CT ABDOMEN AND PELVIS WITH CONTRAST  TECHNIQUE: Multidetector CT imaging of the abdomen and pelvis was performed using the standard protocol following bolus administration of intravenous contrast.  CONTRAST:  OMNIPAQUE IOHEXOL 300 MG/ML  SOLN  COMPARISON:  Abdominal radiograph performed earlier today at 7:35 p.m.  FINDINGS: A trace left pleural effusion is noted, with mild bilateral peripheral scarring.  There is a slightly nodular contour to the liver, without definite evidence of cirrhotic change. The spleen is unremarkable in appearance; vascular calcification is noted at the splenic hilum. The gallbladder is decompressed and not well assessed. The pancreas and adrenal glands are within normal limits.  The kidneys are unremarkable in appearance. There is no evidence of hydronephrosis. No renal or ureteral stones are seen. Nonspecific perinephric stranding is noted bilaterally.  No free fluid is identified. The small bowel is unremarkable in appearance. The stomach is within normal limits. No acute vascular abnormalities are seen. Diffuse calcification is noted along the abdominal aorta and its branches, with mild thrombus noted along the distal descending thoracic aorta.  The appendix is normal in caliber, without evidence for appendicitis. There is  intermittent distention of the colon, still within normal limits in caliber, with fluid seen filling the mid to distal sigmoid colon and rectum. Apparent mild wall thickening is noted along the mid sigmoid colon, possibly reflecting a mild infectious or inflammatory process. The colon is mildly redundant.  The bladder is mildly distended and grossly unremarkable in appearance. The uterus remains normal in size, with a single small calcified fibroid. The ovaries are relatively symmetric ; no suspicious  adnexal masses are seen. Diffuse presacral stranding is nonspecific in appearance. No inguinal lymphadenopathy is seen.  No acute osseous abnormalities are identified. Left convex lumbar scoliosis is noted, with associated vacuum phenomenon.  IMPRESSION: 1. Intermittent distention of the colon, still within normal limits in caliber, with fluid filling the mid to distal sigmoid colon and rectum. Apparent mild wall thickening along the mid sigmoid colon may reflect a mild infectious or inflammatory process. 2. Trace left pleural effusion, with mild bilateral peripheral scarring. 3. Diffuse calcification along the abdominal aorta and its branches, with mild thrombus along the distal descending thoracic aorta. 4. Nonspecific diffuse presacral stranding noted. 5. Left convex lumbar scoliosis noted.   Electronically Signed   By: Roanna Raider M.D.   On: 10/31/2013 22:52   Dg Abd 2 Views  11/08/2013   CLINICAL DATA:  Abdominal distention  EXAM: ABDOMEN - 2 VIEW  COMPARISON:  11/07/2013  FINDINGS: Scattered large and small bowel gas is noted. Mild colonic distention is seen. No free air is seen. The overall appearance is stable from the prior exam. These changes to are most consistent with an ileus.  IMPRESSION: Changes most consistent with an ileus. The overall appearance is stable from the previous exam.   Electronically Signed   By: Alcide Clever M.D.   On: 11/08/2013 15:53   Dg Abd 2 Views  11/07/2013   CLINICAL  DATA:  Abdominal distention and pain  EXAM: ABDOMEN - 2 VIEW  COMPARISON:  11/06/2013  FINDINGS: When compared to previous study there has been slight decompression of the gaseous distended loops of large bowel. Air-filled loops of small bowel once again appreciated. There is levoscoliosis of the lumbar spine. Vascular calcifications identified within the lower abdominal aorta.  IMPRESSION: Findings again likely reflecting an ileus. There does appear to be slight decompression. Continued surveillance evaluation recommended.   Electronically Signed   By: Salome Holmes M.D.   On: 11/07/2013 14:09   Dg Abd 2 Views  11/06/2013   CLINICAL DATA:  Distended abdomen.  EXAM: ABDOMEN - 2 VIEW  COMPARISON:  11/03/2013.  FINDINGS: Gas distended colon measuring up to 9.9 cm. This may represent an ileus related to inflammation although distal obstructing lesion not excluded. No free intraperitoneal air.  IMPRESSION: Gas distended colon measuring up to 9.9 cm. This may represent an ileus related to inflammation although distal obstructing lesion not excluded. No free intraperitoneal air.   Electronically Signed   By: Bridgett Larsson M.D.   On: 11/06/2013 14:07   Dg Abd Acute W/chest  10/31/2013   CLINICAL DATA:  Distended abdomen.  EXAM: ACUTE ABDOMEN SERIES (ABDOMEN 2 VIEW & CHEST 1 VIEW)  COMPARISON:  CT stress set chest radiograph from 4/18/7  FINDINGS: Moderate distension of the large bowel loops identified. No radiopaque calculi or other significant radiographic abnormality is seen. Heart size is mildly enlarged. There is blunting of the costophrenic angles as well as pulmonary vascular congestion. Both lungs are clear.  IMPRESSION: 1. Moderate distension of the large bowel loops identified. Findings may reflect colonic ileus or distal bowel obstruction. Consider further assessment with CT of the abdomen and pelvis with oral contrast material. 2. Suspect mild CHF.   Electronically Signed   By: Signa Kell M.D.   On:  10/31/2013 19:49   Dg Abd Portable 1v  11/03/2013   CLINICAL DATA:  Abdominal distension question obstruction  EXAM: PORTABLE ABDOMEN - 1 VIEW  COMPARISON:  Portable exam 1218 hr compared to 10/31/2013  FINDINGS: Air-filled loops of large and small bowel throughout abdomen.  No definite bowel wall thickening.  Bones appear diffusely demineralized with mild scattered degenerative changes and scoliosis of the lumbar spine.  No pathologic calcifications.  IMPRESSION: Nonspecific bowel gas pattern with air-filled loops of large and small bowel throughout abdomen.  No definite evidence of obstruction or wall thickening.   Electronically Signed   By: Ulyses Southward M.D.   On: 11/03/2013 14:21    Microbiology: Recent Results (from the past 240 hour(s))  CLOSTRIDIUM DIFFICILE BY PCR     Status: None   Collection Time    11/06/13  1:17 AM      Result Value Range Status   C difficile by pcr NEGATIVE  NEGATIVE Final     Labs: Basic Metabolic Panel:  Recent Labs Lab 11/06/13 0526 11/07/13 0426 11/08/13 0538 11/09/13 0440 11/10/13 0615  NA 128* 132* 138 138 143  K 3.5 3.3* 3.8 3.7 4.2  CL 92* 93* 99 97 101  CO2 25 27 29 28 30   GLUCOSE 144* 126* 122* 122* 127*  BUN 17 17 17 21  26*  CREATININE 0.67 0.60 0.59 0.62 0.72  CALCIUM 8.1* 8.3* 8.7 8.7 9.1   Liver Function Tests: No results found for this basename: AST, ALT, ALKPHOS, BILITOT, PROT, ALBUMIN,  in the last 168 hours No results found for this basename: LIPASE, AMYLASE,  in the last 168 hours No results found for this basename: AMMONIA,  in the last 168 hours CBC:  Recent Labs Lab 11/06/13 0526 11/08/13 0538  WBC 9.8 11.6*  HGB 11.9* 13.1  HCT 34.4* 37.6  MCV 87.8 90.2  PLT 254 307   Cardiac Enzymes: No results found for this basename: CKTOTAL, CKMB, CKMBINDEX, TROPONINI,  in the last 168 hours BNP: BNP (last 3 results)  Recent Labs  10/31/13 1853  PROBNP 4119.0*   CBG:  Recent Labs Lab 11/05/13 2126  GLUCAP 106*        Signed:  Asuncion Shibata  [facility] 11/11/2013, 12:26 PM

## 2013-11-11 NOTE — Progress Notes (Addendum)
Report called to "Vidette" @ [facility]. No questions @ this time.All belongings packed per Chermaine,NT. Patient aware of transfer back to facility.Cherlyn Roberts, spouse notified of transfer.

## 2013-11-11 NOTE — Clinical Social Work Note (Signed)
Patient medically stable for discharge today back to [facility] skilled nursing facility. Discharge information transmitted to facility and d/c packet compiled. CSW facilitated transport to facility via ambulance. Husband contacted by nurse regarding discharge.  Genelle Bal, MSW, LCSW [phone]

## 2013-11-12 ENCOUNTER — Encounter: Payer: Self-pay | Admitting: Nurse Practitioner

## 2013-11-12 NOTE — Assessment & Plan Note (Signed)
Started Gabapentin 100mg  bid since 10/14/13 for pain--helped but not adequate--better controlled with Gabapentin 100mg  tid and able to decrease Hydrocodone to bid prn.

## 2013-11-12 NOTE — Assessment & Plan Note (Signed)
Skin eruption has healed at the left flank and lateral chest.

## 2013-11-12 NOTE — Assessment & Plan Note (Signed)
C-diff negative. Loose stools.

## 2013-11-12 NOTE — Assessment & Plan Note (Signed)
Compensated clinically.  

## 2013-11-12 NOTE — Assessment & Plan Note (Signed)
Controlled.  

## 2013-11-12 NOTE — Assessment & Plan Note (Signed)
MMSE 20/30 10/2013 

## 2013-11-12 NOTE — Assessment & Plan Note (Addendum)
Distended abd and pain when palpated. Rectal tube and side to side repositioning frequently. Pain is to be managed with Tylenol and occasional Norco since Narcotics can worsen distended colons. Serial KUB as needed. Patient does have abdominal distention with CT findings of infectious versus inflammation of admission.Marland Kitchen on empiric Cipro and Flagyl, completed 5 days of antibiotics. She continues to have loose BM. C DIFF pcr negative. Vomiting has resolved. Abdominal x-ray repeated on 12/8 shows distended bowel gas possibly ileus,. No obstruction seen. Surgery consulted recommended ambulation, replete K and prokinetic agents.  -she had a CT abd showed improvement in the colon distension.

## 2013-11-12 NOTE — Assessment & Plan Note (Signed)
No NSAIDs or nephrotoxic meds used.Serum osmolality was low. normal urine sodium and urine osmolality. Renal recommendation continue fluid restriction and placed on regular diet.Her sodium is improving. Recommendations to stop the sodium tabs and lasix as per renal. TSH and uric acid normal . Update BMP

## 2013-11-12 NOTE — Assessment & Plan Note (Signed)
Rate controlled on Digoxin 0.125mg and Atenolol 50mg daily, Dig level 0.3 10/02/13                 

## 2013-11-12 NOTE — Assessment & Plan Note (Signed)
For AFib

## 2013-11-12 NOTE — Assessment & Plan Note (Signed)
Chronic and longstanding needle pricking pain in feet. Takes Tylenol routinely at home. Hx of Guillain Barre.   

## 2013-11-12 NOTE — Assessment & Plan Note (Signed)
Adequately supplemented with Levothyroxine 125mcg, TSH 3.573 10/02/13               

## 2013-11-12 NOTE — Assessment & Plan Note (Signed)
Hgb 13s while in hospital.

## 2013-11-13 LAB — BASIC METABOLIC PANEL
Glucose: 94 mg/dL
Potassium: 3.6 mmol/L (ref 3.4–5.3)
Sodium: 144 mmol/L (ref 137–147)

## 2013-11-13 LAB — POCT INR: INR: 3.5 — AB (ref 0.9–1.1)

## 2013-11-13 LAB — PROTIME-INR

## 2013-11-14 ENCOUNTER — Non-Acute Institutional Stay (SKILLED_NURSING_FACILITY): Payer: Medicare Other | Admitting: Nurse Practitioner

## 2013-11-14 ENCOUNTER — Encounter: Payer: Self-pay | Admitting: Nurse Practitioner

## 2013-11-14 DIAGNOSIS — I4891 Unspecified atrial fibrillation: Secondary | ICD-10-CM

## 2013-11-14 DIAGNOSIS — K6389 Other specified diseases of intestine: Secondary | ICD-10-CM

## 2013-11-14 DIAGNOSIS — E871 Hypo-osmolality and hyponatremia: Secondary | ICD-10-CM

## 2013-11-14 DIAGNOSIS — I509 Heart failure, unspecified: Secondary | ICD-10-CM

## 2013-11-14 DIAGNOSIS — I10 Essential (primary) hypertension: Secondary | ICD-10-CM

## 2013-11-14 DIAGNOSIS — E876 Hypokalemia: Secondary | ICD-10-CM

## 2013-11-14 DIAGNOSIS — D638 Anemia in other chronic diseases classified elsewhere: Secondary | ICD-10-CM

## 2013-11-14 DIAGNOSIS — R062 Wheezing: Secondary | ICD-10-CM

## 2013-11-14 NOTE — Assessment & Plan Note (Signed)
Stable, Hgb 12s 

## 2013-11-14 NOTE — Progress Notes (Signed)
Patient ID: Shirley Mccormick, female   DOB: January 14, 1925, 79 y.o.   MRN: [id number]   Code Status: DNR  Allergies  Allergen Reactions  . Erythromycin Rash  . Penicillins Rash  . Tetanus Toxoids Rash    Chief Complaint  Patient presents with  . Medical Managment of Chronic Issues    distended colon, wheezes  . Acute Visit    HPI: Patient is a 79 y.o. female seen in the SNF at [facility] today for evaluation of distended abd and abd pain, wheezing,  and other chronic medical conditions. Hospitalized 10/31/2013 -11/11/2013. She has history of A. fib on Coumadin, hypothyroidism, peripheral neuropathy, history of recent shingles. The patient presented to ED with 2 episodes of fall which was thought to be mechanical. She also had projectile vomiting on the day of admission. Head CT was unremarkable. Patient also found to have abdominal distention with CT scan of the abdomen and pelvis showing wall thickening along the mid sigmoid colon concerning for infectious versus inflammatory process. Problem List Items Addressed This Visit   Hyponatremia     No NSAIDs or nephrotoxic meds used.Serum osmolality was low. normal urine sodium and urine osmolality. Renal recommendation continue fluid restriction and placed on regular diet.Her sodium is improving. Recommendations to stop the sodium tabs and lasix as per renal. TSH and uric acid normal . Na 144 11/13/13       A-fib     Rate controlled on Digoxin 0.125mg  and Atenolol 50mg  daily, Dig level 0.3 10/02/13                Wheezes     Mild diffused expiratory wheeze. Denied colored sputum production or chest  Pain or SOB. ?developing CHF since the BLE edema persents. Neb tx tid, CXR, Medrol dose pk,       Anemia, chronic disease     Stable, Hgb 12s      CHF (congestive heart failure)     Edema and wheezes appreciated today--CXR to evaluate further. Cardiac vs pulmonary etiology-ABT vs diuretics.      Hypokalemia     Resolved,  serum K 3.6 11/13/13    Accelerated hypertension     Controlled with mildly elevated SBP 140-150      Colon distention - Primary     Distended abd and pain when palpated. Rectal tube and side to side repositioning frequently helped-less distention and abd pain. Pain is to be managed with Tylenol and occasional Norco since Narcotics can worsen distended colons. Serial KUB as needed. Patient does have abdominal distention with CT findings of infectious versus inflammation of admission. She continues to have loose BM. C DIFF per negative. Vomiting has resolved. Abdominal x-ray repeated on 12/8 shows distended bowel gas possibly ileus,. No obstruction seen. Surgery consulted recommended ambulation, replete K and prokinetic agents-will try Reglan 5mg  po ac and hs for now.  -she had a CT abd showed improvement in the colon distension.           Review of Systems:  Review of Systems  Constitutional: Positive for malaise/fatigue. Negative for fever, chills, weight loss and diaphoresis.       Generalized and lower body weakness.   HENT: Positive for hearing loss. Negative for congestion, ear discharge, ear pain, nosebleeds, sore throat and tinnitus.   Eyes: Negative for blurred vision, double vision, photophobia, pain, discharge and redness.  Respiratory: Positive for wheezing. Negative for cough, hemoptysis, sputum production, shortness of breath and stridor.   Cardiovascular: Positive for  leg swelling. Negative for chest pain, palpitations, orthopnea, claudication and PND.       BLE  Gastrointestinal: Positive for abdominal pain. Negative for heartburn, nausea, vomiting, diarrhea, constipation, blood in stool and melena.       Distended abd and pain when palpated.   Genitourinary: Positive for frequency. Negative for dysuria, urgency, hematuria and flank pain.  Musculoskeletal: Positive for back pain. Negative for falls, joint pain, myalgias and neck pain.       From her left shingle site  travels to her left back.   Skin: Positive for rash.       Left flank and abd shingle lesions healing nicely. Bruise at the left forehead into her hairline.   Neurological: Positive for sensory change and weakness. Negative for dizziness, tingling, tremors, speech change, focal weakness, seizures, loss of consciousness and headaches.       BLE needle pricking. Left flank postherpetic neuralgia.   Endo/Heme/Allergies: Negative for environmental allergies and polydipsia. Bruises/bleeds easily.  Psychiatric/Behavioral: Positive for memory loss. Negative for depression, suicidal ideas, hallucinations and substance abuse. The patient is not nervous/anxious and does not have insomnia.        Mild confusion occasionally.      Past Medical History  Diagnosis Date  . Afib   . Shingles 10/01/13  . Pulmonary embolism 1990  . Guillain-Barre 1990    resolved  . Unspecified hypothyroidism   . Neuropathy    Past Surgical History  Procedure Laterality Date  . Breast lumpectomy Right 1965    Dr. Judithe Modest   Social History:   reports that she has never smoked. She does not have any smokeless tobacco history on file. She reports that she does not drink alcohol or use illicit drugs.  Medications: Patient's Medications  New Prescriptions   No medications on file  Previous Medications   ACETAMINOPHEN (TYLENOL) 325 MG TABLET    Take 650 mg by mouth every 6 (six) hours as needed for mild pain.   AMLODIPINE (NORVASC) 5 MG TABLET    Take 1 tablet (5 mg total) by mouth daily.   ATENOLOL (TENORMIN) 50 MG TABLET    Take 50 mg by mouth daily.   CHOLECALCIFEROL (VITAMIN D) 1000 UNITS TABLET    Take 1,000 Units by mouth every morning.    DIGOXIN (LANOXIN) 0.125 MG TABLET    Take 0.125 mg by mouth every morning.    DIPHENHYDRAMINE-ACETAMINOPHEN (TYLENOL PM) 25-500 MG TABS    Take 2 tablets by mouth at bedtime.   FEEDING SUPPLEMENT, ENSURE COMPLETE, (ENSURE COMPLETE) LIQD    Take 237 mLs by mouth 2 (two) times  daily between meals.   FISH OIL-OMEGA-3 FATTY ACIDS 1000 MG CAPSULE    Take 1 g by mouth 2 (two) times daily.    GABAPENTIN (NEURONTIN) 100 MG CAPSULE    Take 100 mg by mouth 3 (three) times daily.   GLUCOSAMINE-CHONDROITIN 500-400 MG TABLET    Take 1 tablet by mouth 3 (three) times daily.   HYDRALAZINE (APRESOLINE) 25 MG TABLET    Take 1 tablet (25 mg total) by mouth every 8 (eight) hours.   IPRATROPIUM-ALBUTEROL (DUONEB) 0.5-2.5 (3) MG/3ML SOLN    Take 3 mLs by nebulization 3 (three) times daily. For 3 days only. START 12.3.14 END 12.5.14   LEVOTHYROXINE (SYNTHROID, LEVOTHROID) 125 MCG TABLET    Take 125 mcg by mouth daily.   METOCLOPRAMIDE (REGLAN) 5 MG TABLET    Take 5 mg by mouth 4 (four) times daily -  before meals and at bedtime.   MULTIPLE VITAMINS-MINERALS (CERTAVITE/ANTIOXIDANTS PO)    Take 1 tablet by mouth every morning.   WARFARIN (COUMADIN) 1 MG TABLET    Take 7 mg by mouth daily at 6 PM.   WARFARIN (COUMADIN) 6 MG TABLET    Take 6 mg by mouth daily at 6 PM.  Modified Medications   No medications on file  Discontinued Medications   No medications on file     Physical Exam: Physical Exam  Constitutional: She is oriented to person, place, and time. She appears well-developed and well-nourished. No distress.  HENT:  Head: Normocephalic and atraumatic.  Right Ear: External ear normal.  Left Ear: External ear normal.  Nose: Nose normal.  Mouth/Throat: Oropharynx is clear and moist. No oropharyngeal exudate.  Eyes: Conjunctivae and EOM are normal. Pupils are equal, round, and reactive to light. Right eye exhibits no discharge. Left eye exhibits no discharge. No scleral icterus.  Neck: Normal range of motion. Neck supple. No JVD present. No tracheal deviation present. No thyromegaly present.  Cardiovascular: Normal rate, regular rhythm, normal heart sounds and intact distal pulses.   No murmur heard. Pulmonary/Chest: Effort normal. No stridor. No respiratory distress. She has  wheezes. She has no rales.  Abdominal: Soft. She exhibits distension. Bowel sounds are increased. There is tenderness. There is no rebound and no guarding.  Musculoskeletal: Normal range of motion. She exhibits edema. She exhibits no tenderness.  Unable to stand on her own. BLE edema 2+  Lymphadenopathy:    She has no cervical adenopathy.  Neurological: She is alert and oriented to person, place, and time. She has normal reflexes. No cranial nerve deficit. She exhibits normal muscle tone. Coordination normal.  Skin: Skin is warm and dry. Rash noted. She is not diaphoretic. There is erythema.  Shingles left flank and abd-healing nicely.   Psychiatric: She has a normal mood and affect. Her behavior is normal. Judgment and thought content normal.    Filed Vitals:   11/14/13 1129  BP: 154/72  Pulse: 88  Temp: 97.3 F (36.3 C)  TempSrc: Tympanic  Resp: 18  SpO2: 96%      Labs reviewed: Basic Metabolic Panel:  Recent Labs  47/82/95 0522 11/01/13 1610  11/08/13 0538 11/09/13 0440 11/10/13 0615 11/13/13  NA 121*  --   < > 138 138 143 144  K 3.6  --   < > 3.8 3.7 4.2 3.6  CL 83*  --   < > 99 97 101  --   CO2 23  --   < > 29 28 30   --   GLUCOSE 115*  --   < > 122* 122* 127*  --   BUN 10  --   < > 17 21 26* 24*  CREATININE 0.54  --   < > 0.59 0.62 0.72 0.7  CALCIUM 8.7  --   < > 8.7 8.7 9.1  --   TSH  --  3.021  --   --   --   --   --   < > = values in this interval not displayed. Liver Function Tests:  Recent Labs  10/01/13 1301 11/01/13 0522  AST 31 23  ALT 21 13  ALKPHOS 47 42  BILITOT 0.4 0.7  PROT 7.6 6.8  ALBUMIN 3.7 3.1*   CBC:  Recent Labs  10/01/13 1301  10/31/13 1845  11/01/13 0522 11/06/13 0526 11/08/13 0538  WBC 5.2  < > 7.5  --  8.0 9.8 11.6*  NEUTROABS 3.0  --  5.2  --   --   --   --   HGB 13.5  < > 12.4  < > 12.6 11.9* 13.1  HCT 39.3  < > 35.0*  < > 35.7* 34.4* 37.6  MCV 87.5  --  88.2  --  88.4 87.8 90.2  PLT 187  < > 222  --  197 254 307    < > = values in this interval not displayed.  Past Procedures:  Ct Head Wo Contrast  10/31/2013 CLINICAL DATA: Projectile vomiting this morning. Fell 3 days ago. EXAM: CT HEAD WITHOUT CONTRAST CT CERVICAL SPINE WITHOUT CONTRAST: IMPRESSION: 1. Acute left sphenoid sinusitis vs. blood in the sinus. 2. No skull fracture or intracranial hemorrhage seen. 3. No cervical spine fracture or traumatic subluxation. 4. Multilevel cervical spine degenerative changes. 5. Atrophy and chronic small vessel white matter ischemic changes in both cerebral hemispheres.   Ct Cervical Spine Wo Contrast  10/31/2013 CLINICAL DATA: Projectile vomiting this morning. Fell 3 days ago. EXAM: CT HEAD WITHOUT CONTRAST CT CERVICAL SPINE WITHOUT CONTRAST IMPRESSION: 1. Acute left sphenoid sinusitis vs. blood in the sinus. 2. No skull fracture or intracranial hemorrhage seen. 3. No cervical spine fracture or traumatic subluxation. 4. Multilevel cervical spine degenerative changes. 5. Atrophy and chronic small vessel white matter ischemic changes in both cerebral hemispheres.   Ct Abdomen Pelvis W Contrast  11/10/2013 CLINICAL DATA: Followup to abdominal CT performed on 10/31/2013 showing wall thickening of the mid sigmoid colon. EXAM: CT ABDOMEN AND PELVIS WITH CONTRAST IMPRESSION: 1. There is now no evidence of colonic inflammation. There is no bowel wall thickening. The appearance on the current exam is consistent with a diffuse colonic adynamic ileus with mild distention of the colon and rectum and air-fluid levels. 2. No obstruction. The stomach is decompressed and the small bowel normal in caliber. 3. Cardiomegaly, lung base scarring/subsegmental atelectasis and small left effusion, stable from the prior study. 4. Chronic findings in the abdomen and pelvis as detailed above all stable.   Ct Abdomen Pelvis W Contrast  10/31/2013 CLINICAL DATA: Abdominal distention, vomiting and fall. Altered mental status. EXAM: CT ABDOMEN AND PELVIS  WITH CONTRAST IMPRESSION: 1. Intermittent distention of the colon, still within normal limits in caliber, with fluid filling the mid to distal sigmoid colon and rectum. Apparent mild wall thickening along the mid sigmoid colon may reflect a mild infectious or inflammatory process. 2. Trace left pleural effusion, with mild bilateral peripheral scarring. 3. Diffuse calcification along the abdominal aorta and its branches, with mild thrombus along the distal descending thoracic aorta. 4. Nonspecific diffuse presacral stranding noted. 5. Left convex lumbar scoliosis noted.   Dg Abd 2 Views  11/08/2013 CLINICAL DATA: Abdominal distention EXAM: ABDOMEN - 2 VIEW IMPRESSION: Changes most consistent with an ileus. The overall appearance is stable from the previous exam.   Dg Abd 2 Views  11/07/2013 CLINICAL DATA: Abdominal distention and pain EXAM: ABDOMEN - 2 VIIEWS IMPRESSION: Findings again likely reflecting an ileus. There does appear to be slight decompression.   Dg Abd 2 Views  11/06/2013 CLINICAL DATA: Distended abdomen. EXAM: ABDOMEN - 2 VIEW IMPRESSION: Gas distended colon measuring up to 9.9 cm. This may represent an ileus related to inflammation although distal obstructing lesion not excluded. No free intraperitoneal air.   Dg Abd Acute W/chest  10/31/2013 CLINICAL DATA: Distended abdomen. EXAM: ACUTE ABDOMEN SERIES (ABDOMEN 2 VIEW & CHEST 1 VIEW) COMPARISON:  IMPRESSION:  1. Moderate distension of the large bowel loops identified. Findings may reflect colonic ileus or distal bowel obstruction. Consider further assessment with CT of the abdomen and pelvis with oral contrast material. 2. Suspect mild CHF.   Dg Abd Portable 1v  11/03/2013 CLINICAL DATA: Abdominal distension question obstruction EXAM: PORTABLE ABDOMEN IMPRESSION: Nonspecific bowel gas pattern with air-filled loops of large and small bowel throughout abdomen. No definite evidence of obstruction or wall thickening.    Assessment/Plan Colon distention Distended abd and pain when palpated. Rectal tube and side to side repositioning frequently helped-less distention and abd pain. Pain is to be managed with Tylenol and occasional Norco since Narcotics can worsen distended colons. Serial KUB as needed. Patient does have abdominal distention with CT findings of infectious versus inflammation of admission. She continues to have loose BM. C DIFF per negative. Vomiting has resolved. Abdominal x-ray repeated on 12/8 shows distended bowel gas possibly ileus,. No obstruction seen. Surgery consulted recommended ambulation, replete K and prokinetic agents-will try Reglan 5mg  po ac and hs for now.  -she had a CT abd showed improvement in the colon distension.      Hypokalemia Resolved, serum K 3.6 11/13/13  Accelerated hypertension Controlled with mildly elevated SBP 140-150    CHF (congestive heart failure) Edema and wheezes appreciated today--CXR to evaluate further. Cardiac vs pulmonary etiology-ABT vs diuretics.    Anemia, chronic disease Stable, Hgb 12s    Wheezes Mild diffused expiratory wheeze. Denied colored sputum production or chest  Pain or SOB. ?developing CHF since the BLE edema persents. Neb tx tid, CXR, Medrol dose pk,     Hyponatremia No NSAIDs or nephrotoxic meds used.Serum osmolality was low. normal urine sodium and urine osmolality. Renal recommendation continue fluid restriction and placed on regular diet.Her sodium is improving. Recommendations to stop the sodium tabs and lasix as per renal. TSH and uric acid normal . Na 144 11/13/13     A-fib Rate controlled on Digoxin 0.125mg  and Atenolol 50mg  daily, Dig level 0.3 10/02/13                Family/ Staff Communication: observe the patient.   Goals of Care: SNF  Labs/tests ordered: CXR, KUB

## 2013-11-14 NOTE — Assessment & Plan Note (Signed)
Distended abd and pain when palpated. Rectal tube and side to side repositioning frequently helped-less distention and abd pain. Pain is to be managed with Tylenol and occasional Norco since Narcotics can worsen distended colons. Serial KUB as needed. Patient does have abdominal distention with CT findings of infectious versus inflammation of admission. She continues to have loose BM. C DIFF per negative. Vomiting has resolved. Abdominal x-ray repeated on 12/8 shows distended bowel gas possibly ileus,. No obstruction seen. Surgery consulted recommended ambulation, replete K and prokinetic agents-will try Reglan 5mg  po ac and hs for now.  -she had a CT abd showed improvement in the colon distension.

## 2013-11-14 NOTE — Assessment & Plan Note (Signed)
Rate controlled on Digoxin 0.125mg and Atenolol 50mg daily, Dig level 0.3 10/02/13                 

## 2013-11-14 NOTE — Assessment & Plan Note (Signed)
Controlled with mildly elevated SBP 140-150       

## 2013-11-14 NOTE — Assessment & Plan Note (Signed)
Mild diffused expiratory wheeze. Denied colored sputum production or chest  Pain or SOB. ?developing CHF since the BLE edema persents. Neb tx tid, CXR, Medrol dose pk,

## 2013-11-14 NOTE — Assessment & Plan Note (Signed)
Edema and wheezes appreciated today--CXR to evaluate further. Cardiac vs pulmonary etiology-ABT vs diuretics.

## 2013-11-14 NOTE — Assessment & Plan Note (Signed)
No NSAIDs or nephrotoxic meds used.Serum osmolality was low. normal urine sodium and urine osmolality. Renal recommendation continue fluid restriction and placed on regular diet.Her sodium is improving. Recommendations to stop the sodium tabs and lasix as per renal. TSH and uric acid normal . Na 144 11/13/13      

## 2013-11-14 NOTE — Assessment & Plan Note (Signed)
Resolved, serum K 3.6 11/13/13

## 2013-11-18 ENCOUNTER — Encounter: Payer: Self-pay | Admitting: Nurse Practitioner

## 2013-11-18 NOTE — Progress Notes (Signed)
This encounter was created in error - please disregard.

## 2013-11-24 LAB — PROTIME-INR: Protime: 23.5 seconds — AB (ref 10.0–13.8)

## 2013-11-24 LAB — POCT INR: INR: 2.2 — AB (ref 0.9–1.1)

## 2013-11-25 ENCOUNTER — Encounter: Payer: Self-pay | Admitting: Nurse Practitioner

## 2013-11-25 ENCOUNTER — Non-Acute Institutional Stay (SKILLED_NURSING_FACILITY): Payer: Medicare Other | Admitting: Nurse Practitioner

## 2013-11-25 DIAGNOSIS — R062 Wheezing: Secondary | ICD-10-CM

## 2013-11-25 DIAGNOSIS — J984 Other disorders of lung: Secondary | ICD-10-CM | POA: Insufficient documentation

## 2013-11-25 DIAGNOSIS — E039 Hypothyroidism, unspecified: Secondary | ICD-10-CM

## 2013-11-25 DIAGNOSIS — I509 Heart failure, unspecified: Secondary | ICD-10-CM

## 2013-11-25 DIAGNOSIS — E871 Hypo-osmolality and hyponatremia: Secondary | ICD-10-CM

## 2013-11-25 DIAGNOSIS — B0229 Other postherpetic nervous system involvement: Secondary | ICD-10-CM

## 2013-11-25 DIAGNOSIS — L8992 Pressure ulcer of unspecified site, stage 2: Secondary | ICD-10-CM | POA: Insufficient documentation

## 2013-11-25 DIAGNOSIS — D638 Anemia in other chronic diseases classified elsewhere: Secondary | ICD-10-CM

## 2013-11-25 DIAGNOSIS — K6389 Other specified diseases of intestine: Secondary | ICD-10-CM

## 2013-11-25 DIAGNOSIS — J189 Pneumonia, unspecified organism: Secondary | ICD-10-CM

## 2013-11-25 DIAGNOSIS — R609 Edema, unspecified: Secondary | ICD-10-CM

## 2013-11-25 DIAGNOSIS — I10 Essential (primary) hypertension: Secondary | ICD-10-CM

## 2013-11-25 DIAGNOSIS — I4891 Unspecified atrial fibrillation: Secondary | ICD-10-CM

## 2013-11-25 DIAGNOSIS — L8990 Pressure ulcer of unspecified site, unspecified stage: Secondary | ICD-10-CM

## 2013-11-25 NOTE — Assessment & Plan Note (Signed)
Distended abd and pain when palpated-resolved. Continue Reglan.  Patient does have abdominal distention with CT findings of infectious versus inflammation of admission. She continues to have loose BM. C DIFF per negative. Vomiting has resolved. Abdominal x-ray repeated on 12/8 shows distended bowel gas possibly ileus,. No obstruction seen. Surgery consulted recommended ambulation, replete K and prokinetic agents-will try Reglan 5mg po ac and hs for now.  -she had a CT abd showed improvement in the colon distension.         

## 2013-11-25 NOTE — Assessment & Plan Note (Signed)
No NSAIDs or nephrotoxic meds used.Serum osmolality was low. normal urine sodium and urine osmolality. Renal recommendation continue fluid restriction and placed on regular diet.Her sodium is improving. Recommendations to stop the sodium tabs and lasix as per renal. TSH and uric acid normal . Na 144 11/13/13

## 2013-11-25 NOTE — Assessment & Plan Note (Signed)
Stable, Hgb 12s 

## 2013-11-25 NOTE — Assessment & Plan Note (Signed)
Adequately supplemented with Levothyroxine 125mcg, TSH 3.573 10/02/13               

## 2013-11-25 NOTE — Assessment & Plan Note (Signed)
Started Gabapentin 100mg bid since 10/14/13 for pain--helped but not adequate--better controlled with Gabapentin 100mg tid and able to decrease Hydrocodone to bid prn.        

## 2013-11-25 NOTE — Assessment & Plan Note (Signed)
Mild diffused expiratory wheeze. Resolved on Neb tx tid, Medrol dose pk, and total 14 days of Avelox. Started 11/14/13

## 2013-11-25 NOTE — Assessment & Plan Note (Signed)
Controlled with mildly elevated SBP 140-150       

## 2013-11-25 NOTE — Assessment & Plan Note (Signed)
The right heel 5cm x5cm-not infected, pressure reduction, hydrocolloid dressing q3-5 days.    

## 2013-11-25 NOTE — Assessment & Plan Note (Signed)
Improved on total 14 days of Avelox. No wheezes ausculted upon my examination today.

## 2013-11-25 NOTE — Assessment & Plan Note (Signed)
Edema BLE trace 

## 2013-11-25 NOTE — Assessment & Plan Note (Signed)
Trace BLE 

## 2013-11-25 NOTE — Progress Notes (Signed)
Patient ID: Shirley Mccormick, female   DOB: 1925/05/17, 79 y.o.   MRN: [id number]   Code Status: DNR  Allergies  Allergen Reactions  . Erythromycin Rash  . Penicillins Rash  . Tetanus Toxoids Rash    Chief Complaint  Patient presents with  . Medical Managment of Chronic Issues    PNA  . Acute Visit    HPI: Patient is a 79 y.o. female seen in the SNF at [facility] today for evaluation of distended abd and abd pain, PNA,  and other chronic medical conditions.  Problem List Items Addressed This Visit   Hyponatremia     No NSAIDs or nephrotoxic meds used.Serum osmolality was low. normal urine sodium and urine osmolality. Renal recommendation continue fluid restriction and placed on regular diet.Her sodium is improving. Recommendations to stop the sodium tabs and lasix as per renal. TSH and uric acid normal . Na 144 11/13/13         A-fib     Rate controlled on Digoxin 0.125mg  and Atenolol 50mg  daily, Dig level 0.3 10/02/13                  Unspecified hypothyroidism     Adequately supplemented with Levothyroxine , TSH 3.573 10/02/13                Postherpetic neuralgia     Started Gabapentin 100mg  bid since 10/14/13 for pain--helped but not adequate--better controlled with Gabapentin 100mg  tid and able to decrease Hydrocodone to bid prn.             Edema     Trace BLE    Wheezes     Mild diffused expiratory wheeze. Resolved on Neb tx tid, Medrol dose pk, and total 14 days of Avelox. Started 11/14/13        Anemia, chronic disease     Stable, Hgb 12s        CHF (congestive heart failure)     Edema BLE trace.         Accelerated hypertension     Controlled with mildly elevated SBP 140-150        Colon distention - Primary     Distended abd and pain when palpated-resolved. Continue Reglan.  Patient does have abdominal distention with CT findings of infectious versus inflammation of admission. She continues to  have loose BM. C DIFF per negative. Vomiting has resolved. Abdominal x-ray repeated on 12/8 shows distended bowel gas possibly ileus,. No obstruction seen. Surgery consulted recommended ambulation, replete K and prokinetic agents-will try Reglan 5mg  po ac and hs for now.  -she had a CT abd showed improvement in the colon distension.          Pressure ulcer stage II     The right heel 5cm x5cm-not infected, pressure reduction, hydrocolloid dressing q3-5 days.     Pneumonitis     Improved on total 14 days of Avelox. No wheezes ausculted upon my examination today.        Review of Systems:  Review of Systems  Constitutional: Positive for malaise/fatigue. Negative for fever, chills, weight loss and diaphoresis.       Generalized and lower body weakness.   HENT: Positive for hearing loss. Negative for congestion, ear discharge, ear pain, nosebleeds, sore throat and tinnitus.   Eyes: Negative for blurred vision, double vision, photophobia, pain, discharge and redness.  Respiratory: Negative for cough, hemoptysis, sputum production, shortness of breath, wheezing and stridor.   Cardiovascular: Positive for  leg swelling. Negative for chest pain, palpitations, orthopnea, claudication and PND.       BLE  Gastrointestinal: Positive for abdominal pain. Negative for heartburn, nausea, vomiting, diarrhea, constipation, blood in stool and melena.       Distended abd and pain when palpated.   Genitourinary: Positive for frequency. Negative for dysuria, urgency, hematuria and flank pain.  Musculoskeletal: Positive for back pain. Negative for falls, joint pain, myalgias and neck pain.       From her left shingle site travels to her left back.   Skin: Positive for rash.       Left flank and abd shingle lesions healing nicely. R heel stage II pressure ulcer not infected.   Neurological: Positive for sensory change and weakness. Negative for dizziness, tingling, tremors, speech change, focal weakness,  seizures, loss of consciousness and headaches.       BLE needle pricking. Left flank postherpetic neuralgia.   Endo/Heme/Allergies: Negative for environmental allergies and polydipsia. Bruises/bleeds easily.  Psychiatric/Behavioral: Positive for memory loss. Negative for depression, suicidal ideas, hallucinations and substance abuse. The patient is not nervous/anxious and does not have insomnia.        Mild confusion occasionally.      Past Medical History  Diagnosis Date  . Afib   . Shingles 10/01/13  . Pulmonary embolism 1990  . Guillain-Barre 1990    resolved  . Unspecified hypothyroidism   . Neuropathy    Past Surgical History  Procedure Laterality Date  . Breast lumpectomy Right 1965    Dr. Judithe Modest   Social History:   reports that she has never smoked. She does not have any smokeless tobacco history on file. She reports that she does not drink alcohol or use illicit drugs.  Medications: Patient's Medications  New Prescriptions   No medications on file  Previous Medications   ACETAMINOPHEN (TYLENOL) 325 MG TABLET    Take 650 mg by mouth every 6 (six) hours as needed for mild pain.   AMLODIPINE (NORVASC) 5 MG TABLET    Take 1 tablet (5 mg total) by mouth daily.   ATENOLOL (TENORMIN) 50 MG TABLET    Take 50 mg by mouth daily.   CHOLECALCIFEROL (VITAMIN D) 1000 UNITS TABLET    Take 1,000 Units by mouth every morning.    DIGOXIN (LANOXIN) 0.125 MG TABLET    Take 0.125 mg by mouth every morning.    DIPHENHYDRAMINE-ACETAMINOPHEN (TYLENOL PM) 25-500 MG TABS    Take 2 tablets by mouth at bedtime.   FEEDING SUPPLEMENT, ENSURE COMPLETE, (ENSURE COMPLETE) LIQD    Take 237 mLs by mouth 2 (two) times daily between meals.   FISH OIL-OMEGA-3 FATTY ACIDS 1000 MG CAPSULE    Take 1 g by mouth 2 (two) times daily.    GABAPENTIN (NEURONTIN) 100 MG CAPSULE    Take 100 mg by mouth 3 (three) times daily.   GLUCOSAMINE-CHONDROITIN 500-400 MG TABLET    Take 1 tablet by mouth 3 (three) times  daily.   HYDRALAZINE (APRESOLINE) 25 MG TABLET    Take 1 tablet (25 mg total) by mouth every 8 (eight) hours.   IPRATROPIUM-ALBUTEROL (DUONEB) 0.5-2.5 (3) MG/3ML SOLN    Take 3 mLs by nebulization 3 (three) times daily. For 3 days only. START 12.3.14 END 12.5.14   LEVOTHYROXINE (SYNTHROID, LEVOTHROID) 125 MCG TABLET    Take 125 mcg by mouth daily.   METOCLOPRAMIDE (REGLAN) 5 MG TABLET    Take 5 mg by mouth 4 (four) times daily -  before meals and at bedtime.   MULTIPLE VITAMINS-MINERALS (CERTAVITE/ANTIOXIDANTS PO)    Take 1 tablet by mouth every morning.   WARFARIN (COUMADIN) 1 MG TABLET    Take 7 mg by mouth daily at 6 PM.   WARFARIN (COUMADIN) 6 MG TABLET    Take 6 mg by mouth daily at 6 PM.  Modified Medications   No medications on file  Discontinued Medications   No medications on file     Physical Exam: Physical Exam  Constitutional: She is oriented to person, place, and time. She appears well-developed and well-nourished. No distress.  HENT:  Head: Normocephalic and atraumatic.  Right Ear: External ear normal.  Left Ear: External ear normal.  Nose: Nose normal.  Mouth/Throat: Oropharynx is clear and moist. No oropharyngeal exudate.  Eyes: Conjunctivae and EOM are normal. Pupils are equal, round, and reactive to light. Right eye exhibits no discharge. Left eye exhibits no discharge. No scleral icterus.  Neck: Normal range of motion. Neck supple. No JVD present. No tracheal deviation present. No thyromegaly present.  Cardiovascular: Normal rate, regular rhythm, normal heart sounds and intact distal pulses.   No murmur heard. Pulmonary/Chest: Effort normal. No stridor. No respiratory distress. She has no wheezes. She has no rales.  Abdominal: Soft. She exhibits distension. Bowel sounds are increased. There is no tenderness. There is no rebound and no guarding.  Musculoskeletal: Normal range of motion. She exhibits edema. She exhibits no tenderness.  Unable to stand on her own. BLE  edema 2+  Lymphadenopathy:    She has no cervical adenopathy.  Neurological: She is alert and oriented to person, place, and time. She has normal reflexes. No cranial nerve deficit. She exhibits normal muscle tone. Coordination normal.  Skin: Skin is warm and dry. Rash noted. She is not diaphoretic. There is erythema.  Shingles left flank and abd-healing nicely. R heel stage II pressure ulcer not infected.    Psychiatric: She has a normal mood and affect. Her behavior is normal. Judgment and thought content normal.    Filed Vitals:   11/25/13 1521  BP: 148/88  Pulse: 82  Temp: 99 F (37.2 C)  TempSrc: Tympanic  Resp: 20      Labs reviewed: Basic Metabolic Panel:  Recent Labs  16/10/96 0522 11/01/13 1610  11/08/13 0538 11/09/13 0440 11/10/13 0615 11/13/13  NA 121*  --   < > 138 138 143 144  K 3.6  --   < > 3.8 3.7 4.2 3.6  CL 83*  --   < > 99 97 101  --   CO2 23  --   < > 29 28 30   --   GLUCOSE 115*  --   < > 122* 122* 127*  --   BUN 10  --   < > 17 21 26* 24*  CREATININE 0.54  --   < > 0.59 0.62 0.72 0.7  CALCIUM 8.7  --   < > 8.7 8.7 9.1  --   TSH  --  3.021  --   --   --   --   --   < > = values in this interval not displayed. Liver Function Tests:  Recent Labs  10/01/13 1301 11/01/13 0522  AST 31 23  ALT 21 13  ALKPHOS 47 42  BILITOT 0.4 0.7  PROT 7.6 6.8  ALBUMIN 3.7 3.1*   CBC:  Recent Labs  10/01/13 1301  10/31/13 1845  11/01/13 0522 11/06/13 0526 11/08/13 0538  WBC  5.2  < > 7.5  --  8.0 9.8 11.6*  NEUTROABS 3.0  --  5.2  --   --   --   --   HGB 13.5  < > 12.4  < > 12.6 11.9* 13.1  HCT 39.3  < > 35.0*  < > 35.7* 34.4* 37.6  MCV 87.5  --  88.2  --  88.4 87.8 90.2  PLT 187  < > 222  --  197 254 307  < > = values in this interval not displayed.  Past Procedures:  Ct Head Wo Contrast  10/31/2013 CLINICAL DATA: Projectile vomiting this morning. Fell 3 days ago. EXAM: CT HEAD WITHOUT CONTRAST CT CERVICAL SPINE WITHOUT CONTRAST: IMPRESSION: 1.  Acute left sphenoid sinusitis vs. blood in the sinus. 2. No skull fracture or intracranial hemorrhage seen. 3. No cervical spine fracture or traumatic subluxation. 4. Multilevel cervical spine degenerative changes. 5. Atrophy and chronic small vessel white matter ischemic changes in both cerebral hemispheres.   Ct Cervical Spine Wo Contrast  10/31/2013 CLINICAL DATA: Projectile vomiting this morning. Fell 3 days ago. EXAM: CT HEAD WITHOUT CONTRAST CT CERVICAL SPINE WITHOUT CONTRAST IMPRESSION: 1. Acute left sphenoid sinusitis vs. blood in the sinus. 2. No skull fracture or intracranial hemorrhage seen. 3. No cervical spine fracture or traumatic subluxation. 4. Multilevel cervical spine degenerative changes. 5. Atrophy and chronic small vessel white matter ischemic changes in both cerebral hemispheres.   Ct Abdomen Pelvis W Contrast  11/10/2013 CLINICAL DATA: Followup to abdominal CT performed on 10/31/2013 showing wall thickening of the mid sigmoid colon. EXAM: CT ABDOMEN AND PELVIS WITH CONTRAST IMPRESSION: 1. There is now no evidence of colonic inflammation. There is no bowel wall thickening. The appearance on the current exam is consistent with a diffuse colonic adynamic ileus with mild distention of the colon and rectum and air-fluid levels. 2. No obstruction. The stomach is decompressed and the small bowel normal in caliber. 3. Cardiomegaly, lung base scarring/subsegmental atelectasis and small left effusion, stable from the prior study. 4. Chronic findings in the abdomen and pelvis as detailed above all stable.   Ct Abdomen Pelvis W Contrast  10/31/2013 CLINICAL DATA: Abdominal distention, vomiting and fall. Altered mental status. EXAM: CT ABDOMEN AND PELVIS WITH CONTRAST IMPRESSION: 1. Intermittent distention of the colon, still within normal limits in caliber, with fluid filling the mid to distal sigmoid colon and rectum. Apparent mild wall thickening along the mid sigmoid colon may reflect a mild  infectious or inflammatory process. 2. Trace left pleural effusion, with mild bilateral peripheral scarring. 3. Diffuse calcification along the abdominal aorta and its branches, with mild thrombus along the distal descending thoracic aorta. 4. Nonspecific diffuse presacral stranding noted. 5. Left convex lumbar scoliosis noted.   Dg Abd 2 Views  11/08/2013 CLINICAL DATA: Abdominal distention EXAM: ABDOMEN - 2 VIEW IMPRESSION: Changes most consistent with an ileus. The overall appearance is stable from the previous exam.   Dg Abd 2 Views  11/07/2013 CLINICAL DATA: Abdominal distention and pain EXAM: ABDOMEN - 2 VIIEWS IMPRESSION: Findings again likely reflecting an ileus. There does appear to be slight decompression.   Dg Abd 2 Views  11/06/2013 CLINICAL DATA: Distended abdomen. EXAM: ABDOMEN - 2 VIEW IMPRESSION: Gas distended colon measuring up to 9.9 cm. This may represent an ileus related to inflammation although distal obstructing lesion not excluded. No free intraperitoneal air.   Dg Abd Acute W/chest  10/31/2013 CLINICAL DATA: Distended abdomen. EXAM: ACUTE ABDOMEN SERIES (ABDOMEN 2 VIEW &  CHEST 1 VIEW) COMPARISON:  IMPRESSION: 1. Moderate distension of the large bowel loops identified. Findings may reflect colonic ileus or distal bowel obstruction. Consider further assessment with CT of the abdomen and pelvis with oral contrast material. 2. Suspect mild CHF.   Dg Abd Portable 1v  11/03/2013 CLINICAL DATA: Abdominal distension question obstruction EXAM: PORTABLE ABDOMEN IMPRESSION: Nonspecific bowel gas pattern with air-filled loops of large and small bowel throughout abdomen. No definite evidence of obstruction or wall thickening.   Assessment/Plan Colon distention Distended abd and pain when palpated-resolved. Continue Reglan.  Patient does have abdominal distention with CT findings of infectious versus inflammation of admission. She continues to have loose BM. C DIFF per negative. Vomiting  has resolved. Abdominal x-ray repeated on 12/8 shows distended bowel gas possibly ileus,. No obstruction seen. Surgery consulted recommended ambulation, replete K and prokinetic agents-will try Reglan 5mg  po ac and hs for now.  -she had a CT abd showed improvement in the colon distension.        Accelerated hypertension Controlled with mildly elevated SBP 140-150      CHF (congestive heart failure) Edema BLE trace.       Anemia, chronic disease Stable, Hgb 12s      Wheezes Mild diffused expiratory wheeze. Resolved on Neb tx tid, Medrol dose pk, and total 14 days of Avelox. Started 11/14/13      Edema Trace BLE  Postherpetic neuralgia Started Gabapentin 100mg  bid since 10/14/13 for pain--helped but not adequate--better controlled with Gabapentin 100mg  tid and able to decrease Hydrocodone to bid prn.           Unspecified hypothyroidism Adequately supplemented with Levothyroxine , TSH 3.573 10/02/13              A-fib Rate controlled on Digoxin 0.125mg  and Atenolol 50mg  daily, Dig level 0.3 10/02/13                Hyponatremia No NSAIDs or nephrotoxic meds used.Serum osmolality was low. normal urine sodium and urine osmolality. Renal recommendation continue fluid restriction and placed on regular diet.Her sodium is improving. Recommendations to stop the sodium tabs and lasix as per renal. TSH and uric acid normal . Na 144 11/13/13       Pressure ulcer stage II The right heel 5cm x5cm-not infected, pressure reduction, hydrocolloid dressing q3-5 days.   Pneumonitis Improved on total 14 days of Avelox. No wheezes ausculted upon my examination today.     Family/ Staff Communication: observe the patient.   Goals of Care: SNF  Labs/tests ordered: monitor BMP, Digoxin level, PT/INR.

## 2013-11-25 NOTE — Assessment & Plan Note (Signed)
Rate controlled on Digoxin 0.125mg and Atenolol 50mg daily, Dig level 0.3 10/02/13                 

## 2013-11-28 ENCOUNTER — Encounter: Payer: Self-pay | Admitting: Nurse Practitioner

## 2013-11-28 ENCOUNTER — Non-Acute Institutional Stay (SKILLED_NURSING_FACILITY): Payer: Medicare Other | Admitting: Nurse Practitioner

## 2013-11-28 DIAGNOSIS — L8990 Pressure ulcer of unspecified site, unspecified stage: Secondary | ICD-10-CM

## 2013-11-28 DIAGNOSIS — E039 Hypothyroidism, unspecified: Secondary | ICD-10-CM

## 2013-11-28 DIAGNOSIS — I4891 Unspecified atrial fibrillation: Secondary | ICD-10-CM

## 2013-11-28 DIAGNOSIS — B0229 Other postherpetic nervous system involvement: Secondary | ICD-10-CM

## 2013-11-28 DIAGNOSIS — R609 Edema, unspecified: Secondary | ICD-10-CM

## 2013-11-28 DIAGNOSIS — D638 Anemia in other chronic diseases classified elsewhere: Secondary | ICD-10-CM

## 2013-11-28 DIAGNOSIS — I10 Essential (primary) hypertension: Secondary | ICD-10-CM

## 2013-11-28 DIAGNOSIS — L8992 Pressure ulcer of unspecified site, stage 2: Secondary | ICD-10-CM

## 2013-11-28 DIAGNOSIS — I509 Heart failure, unspecified: Secondary | ICD-10-CM

## 2013-11-28 DIAGNOSIS — J189 Pneumonia, unspecified organism: Secondary | ICD-10-CM

## 2013-11-28 DIAGNOSIS — K6389 Other specified diseases of intestine: Secondary | ICD-10-CM

## 2013-11-28 NOTE — Assessment & Plan Note (Signed)
Rate controlled on Digoxin 0.125mg  and Atenolol 50mg  daily, Dig level 0.3 10/02/13

## 2013-11-28 NOTE — Assessment & Plan Note (Signed)
Adequately supplemented with Levothyroxine 125mcg, TSH 3.573 10/02/13

## 2013-11-28 NOTE — Assessment & Plan Note (Signed)
Worse BLE, Furosemide 20mg 

## 2013-11-28 NOTE — Assessment & Plan Note (Signed)
Relapsed as evidenced in fever, SOB, cough after completed total 14 days of Avelox. Repeated CXR showed RLL consistent with PNA-7 day course of Rocephin started 11/27/12. Fever resolved today-may consider blood culture is fever recurs. Also CXR 11/27/12 showed CHF. The patient has noted with increased BLE edema--Lasix 20mg  daily to be gently diuresed. Monitor the patient.

## 2013-11-28 NOTE — Assessment & Plan Note (Addendum)
Stable, Hgb 12s. Update CBC

## 2013-11-28 NOTE — Assessment & Plan Note (Signed)
Distended abd and pain when palpated-resolved. Continue Reglan.  Patient does have abdominal distention with CT findings of infectious versus inflammation of admission. She continues to have loose BM. C DIFF per negative. Vomiting has resolved. Abdominal x-ray repeated on 12/8 shows distended bowel gas possibly ileus,. No obstruction seen. Surgery consulted recommended ambulation, replete K and prokinetic agents-will try Reglan 5mg  po ac and hs for now.  -she had a CT abd showed improvement in the colon distension.

## 2013-11-28 NOTE — Assessment & Plan Note (Signed)
Controlled with mildly elevated SBP 140-150       

## 2013-11-28 NOTE — Assessment & Plan Note (Signed)
The right heel 5cm x5cm-not infected, pressure reduction, hydrocolloid dressing q3-5 days.

## 2013-11-28 NOTE — Assessment & Plan Note (Signed)
CXR 11/27/12 showed CHF, increased BLE edema and SOB and moist rales bibasilar. Will gentle diurese her with Furosemide 20mg  along with Kcl 10meq daily. F/u BMP and BNP

## 2013-11-28 NOTE — Assessment & Plan Note (Signed)
better controlled with Gabapentin 100mg  tid and able to decrease Hydrocodone to bid prn. Hold Gabapentin since the patient appears drowsy.

## 2013-11-28 NOTE — Progress Notes (Signed)
Patient ID: Shirley Mccormick, female   DOB: 1925-08-04, 80 y.o.   MRN: [id number]   Code Status: DNR  Allergies  Allergen Reactions  . Erythromycin Rash  . Penicillins Rash  . Tetanus Toxoids Rash    Chief Complaint  Patient presents with  . Medical Managment of Chronic Issues    fever, SOB, BLE edema.   . Acute Visit    HPI: Patient is a 80 y.o. female seen in the SN[facility] at [facility] today for evaluation of distended ab, PNA-SOB/cough/fever, BLE edeam/CHF, and other chronic medical conditions.  Problem List Items Addressed This Visit   A-fib     Rate controlled on Digoxin 0.125mg  and Atenolol 50mg  daily, Dig level 0.3 10/02/13                    Unspecified hypothyroidism     Adequately supplemented with Levothyroxine 125mcg, TSH 3.573 10/02/13                  Postherpetic neuralgia     better controlled with Gabapentin 100mg  tid and able to decrease Hydrocodone to bid prn. Hold Gabapentin since the patient appears drowsy.              Edema     Worse BLE, Furosemide 20mg       Anemia, chronic disease     Stable, Hgb 12s. Update CBC          CHF (congestive heart failure)     CXR 11/27/12 showed CHF, increased BLE edema and SOB and moist rales bibasilar. Will gentle diurese her with Furosemide 20mg  along with Kcl 10meq daily. F/u BMP and BNP           Accelerated hypertension     Controlled with mildly elevated SBP 140-150          Colon distention     Distended abd and pain when palpated-resolved. Continue Reglan.  Patient does have abdominal distention with CT findings of infectious versus inflammation of admission. She continues to have loose BM. C DIFF per negative. Vomiting has resolved. Abdominal x-ray repeated on 12/8 shows distended bowel gas possibly ileus,. No obstruction seen. Surgery consulted recommended ambulation, replete K and prokinetic agents-will try Reglan 5mg  po ac and hs for now.  -she had a CT  abd showed improvement in the colon distension.            Pressure ulcer stage II     The right heel 5cm x5cm-not infected, pressure reduction, hydrocolloid dressing q3-5 days.       Pneumonitis - Primary     Relapsed as evidenced in fever, SOB, cough after completed total 14 days of Avelox. Repeated CXR showed RLL consistent with PNA-7 day course of Rocephin started 11/27/12. Fever resolved today-may consider blood culture is fever recurs. Also CXR 11/27/12 showed CHF. The patient has noted with increased BLE edema--Lasix 20mg  daily to be gently diuresed. Monitor the patient.          Review of Systems:  Review of Systems  Constitutional: Positive for malaise/fatigue. Negative for fever, chills, weight loss and diaphoresis.       Generalized and lower body weakness.   HENT: Positive for hearing loss. Negative for congestion, ear discharge, ear pain, nosebleeds, sore throat and tinnitus.   Eyes: Negative for blurred vision, double vision, photophobia, pain, discharge and redness.  Respiratory: Positive for cough and shortness of breath. Negative for hemoptysis, sputum production, wheezing and stridor.   Cardiovascular:  Positive for leg swelling. Negative for chest pain, palpitations, orthopnea, claudication and PND.       BLE  Gastrointestinal: Positive for abdominal pain. Negative for heartburn, nausea, vomiting, diarrhea, constipation, blood in stool and melena.       Distended abd and pain when palpated.   Genitourinary: Positive for frequency. Negative for dysuria, urgency, hematuria and flank pain.  Musculoskeletal: Positive for back pain. Negative for falls, joint pain, myalgias and neck pain.       From her left shingle site travels to her left back.   Skin: Positive for rash.       Left flank and abd shingle lesions healing nicely. R heel stage II pressure ulcer not infected.   Neurological: Positive for sensory change and weakness. Negative for dizziness, tingling,  tremors, speech change, focal weakness, seizures, loss of consciousness and headaches.       BLE needle pricking. Left flank postherpetic neuralgia.   Endo/Heme/Allergies: Negative for environmental allergies and polydipsia. Bruises/bleeds easily.  Psychiatric/Behavioral: Positive for memory loss. Negative for depression, suicidal ideas, hallucinations and substance abuse. The patient is not nervous/anxious and does not have insomnia.        Mild confusion occasionally.      Past Medical History  Diagnosis Date  . Afib   . Shingles 10/01/13  . Pulmonary embolism 1990  . Guillain-Barre 1990    resolved  . Unspecified hypothyroidism   . Neuropathy    Past Surgical History  Procedure Laterality Date  . Breast lumpectomy Right 1965    Dr. Judithe Modest   Social History:   reports that she has never smoked. She does not have any smokeless tobacco history on file. She reports that she does not drink alcohol or use illicit drugs.  Medications: Patient's Medications  New Prescriptions   No medications on file  Previous Medications   ACETAMINOPHEN (TYLENOL) 325 MG TABLET    Take 650 mg by mouth every 6 (six) hours as needed for mild pain.   AMLODIPINE (NORVASC) 5 MG TABLET    Take 1 tablet (5 mg total) by mouth daily.   ATENOLOL (TENORMIN) 50 MG TABLET    Take 50 mg by mouth daily.   CHOLECALCIFEROL (VITAMIN D) 1000 UNITS TABLET    Take 1,000 Units by mouth every morning.    DIGOXIN (LANOXIN) 0.125 MG TABLET    Take 0.125 mg by mouth every morning.    DIPHENHYDRAMINE-ACETAMINOPHEN (TYLENOL PM) 25-500 MG TABS    Take 2 tablets by mouth at bedtime.   FEEDING SUPPLEMENT, ENSURE COMPLETE, (ENSURE COMPLETE) LIQD    Take 237 mLs by mouth 2 (two) times daily between meals.   FISH OIL-OMEGA-3 FATTY ACIDS 1000 MG CAPSULE    Take 1 g by mouth 2 (two) times daily.    GABAPENTIN (NEURONTIN) 100 MG CAPSULE    Take 100 mg by mouth 3 (three) times daily.   GLUCOSAMINE-CHONDROITIN 500-400 MG TABLET     Take 1 tablet by mouth 3 (three) times daily.   HYDRALAZINE (APRESOLINE) 25 MG TABLET    Take 1 tablet (25 mg total) by mouth every 8 (eight) hours.   IPRATROPIUM-ALBUTEROL (DUONEB) 0.5-2.5 (3) MG/3ML SOLN    Take 3 mLs by nebulization 3 (three) times daily. For 3 days only. START 12.3.14 END 12.5.14   LEVOTHYROXINE (SYNTHROID, LEVOTHROID) 125 MCG TABLET    Take 125 mcg by mouth daily.   METOCLOPRAMIDE (REGLAN) 5 MG TABLET    Take 5 mg by mouth 4 (four)  times daily -  before meals and at bedtime.   MULTIPLE VITAMINS-MINERALS (CERTAVITE/ANTIOXIDANTS PO)    Take 1 tablet by mouth every morning.   WARFARIN (COUMADIN) 1 MG TABLET    Take 7 mg by mouth daily at 6 PM.   WARFARIN (COUMADIN) 6 MG TABLET    Take 6 mg by mouth daily at 6 PM.  Modified Medications   No medications on file  Discontinued Medications   No medications on file     Physical Exam: Physical Exam  Constitutional: She is oriented to person, place, and time. She appears well-developed and well-nourished. No distress.  HENT:  Head: Normocephalic and atraumatic.  Right Ear: External ear normal.  Left Ear: External ear normal.  Nose: Nose normal.  Mouth/Throat: Oropharynx is clear and moist. No oropharyngeal exudate.  Eyes: Conjunctivae and EOM are normal. Pupils are equal, round, and reactive to light. Right eye exhibits no discharge. Left eye exhibits no discharge. No scleral icterus.  Neck: Normal range of motion. Neck supple. No JVD present. No tracheal deviation present. No thyromegaly present.  Cardiovascular: Normal rate, regular rhythm, normal heart sounds and intact distal pulses.   No murmur heard. Pulmonary/Chest: Effort normal. No stridor. No respiratory distress. She has no wheezes. She has rales.  Moist rales bibasilar.   Abdominal: Soft. She exhibits distension. Bowel sounds are increased. There is no tenderness. There is no rebound and no guarding.  Musculoskeletal: Normal range of motion. She exhibits edema.  She exhibits no tenderness.  Unable to stand on her own. BLE edema 2+  Lymphadenopathy:    She has no cervical adenopathy.  Neurological: She is alert and oriented to person, place, and time. She has normal reflexes. No cranial nerve deficit. She exhibits normal muscle tone. Coordination normal.  Skin: Skin is warm and dry. Rash noted. She is not diaphoretic. There is erythema.  Shingles left flank and abd-healing nicely. R heel stage II pressure ulcer not infected.    Psychiatric: She has a normal mood and affect. Her behavior is normal. Judgment and thought content normal.    Filed Vitals:   11/28/13 1507  BP: 158/68  Pulse: 70  Temp: 99.1 F (37.3 C)  TempSrc: Tympanic  Resp: 20      Labs reviewed: Basic Metabolic Panel:  Recent Labs  16/10/96 0522 11/01/13 1610  11/08/13 0538 11/09/13 0440 11/10/13 0615 11/13/13  NA 121*  --   < > 138 138 143 144  K 3.6  --   < > 3.8 3.7 4.2 3.6  CL 83*  --   < > 99 97 101  --   CO2 23  --   < > 29 28 30   --   GLUCOSE 115*  --   < > 122* 122* 127*  --   BUN 10  --   < > 17 21 26* 24*  CREATININE 0.54  --   < > 0.59 0.62 0.72 0.7  CALCIUM 8.7  --   < > 8.7 8.7 9.1  --   TSH  --  3.021  --   --   --   --   --   < > = values in this interval not displayed. Liver Function Tests:  Recent Labs  10/01/13 1301 11/01/13 0522  AST 31 23  ALT 21 13  ALKPHOS 47 42  BILITOT 0.4 0.7  PROT 7.6 6.8  ALBUMIN 3.7 3.1*   CBC:  Recent Labs  10/01/13 1301  10/31/13 1845  11/01/13 0522 11/06/13 0526 11/08/13 0538  WBC 5.2  < > 7.5  --  8.0 9.8 11.6*  NEUTROABS 3.0  --  5.2  --   --   --   --   HGB 13.5  < > 12.4  < > 12.6 11.9* 13.1  HCT 39.3  < > 35.0*  < > 35.7* 34.4* 37.6  MCV 87.5  --  88.2  --  88.4 87.8 90.2  PLT 187  < > 222  --  197 254 307  < > = values in this interval not displayed.  Past Procedures:  Ct Head Wo Contrast  10/31/2013 CLINICAL DATA: Projectile vomiting this morning. Fell 3 days ago. EXAM: CT HEAD  WITHOUT CONTRAST CT CERVICAL SPINE WITHOUT CONTRAST: IMPRESSION: 1. Acute left sphenoid sinusitis vs. blood in the sinus. 2. No skull fracture or intracranial hemorrhage seen. 3. No cervical spine fracture or traumatic subluxation. 4. Multilevel cervical spine degenerative changes. 5. Atrophy and chronic small vessel white matter ischemic changes in both cerebral hemispheres.   Ct Cervical Spine Wo Contrast  10/31/2013 CLINICAL DATA: Projectile vomiting this morning. Fell 3 days ago. EXAM: CT HEAD WITHOUT CONTRAST CT CERVICAL SPINE WITHOUT CONTRAST IMPRESSION: 1. Acute left sphenoid sinusitis vs. blood in the sinus. 2. No skull fracture or intracranial hemorrhage seen. 3. No cervical spine fracture or traumatic subluxation. 4. Multilevel cervical spine degenerative changes. 5. Atrophy and chronic small vessel white matter ischemic changes in both cerebral hemispheres.   Ct Abdomen Pelvis W Contrast  11/10/2013 CLINICAL DATA: Followup to abdominal CT performed on 10/31/2013 showing wall thickening of the mid sigmoid colon. EXAM: CT ABDOMEN AND PELVIS WITH CONTRAST IMPRESSION: 1. There is now no evidence of colonic inflammation. There is no bowel wall thickening. The appearance on the current exam is consistent with a diffuse colonic adynamic ileus with mild distention of the colon and rectum and air-fluid levels. 2. No obstruction. The stomach is decompressed and the small bowel normal in caliber. 3. Cardiomegaly, lung base scarring/subsegmental atelectasis and small left effusion, stable from the prior study. 4. Chronic findings in the abdomen and pelvis as detailed above all stable.   Ct Abdomen Pelvis W Contrast  10/31/2013 CLINICAL DATA: Abdominal distention, vomiting and fall. Altered mental status. EXAM: CT ABDOMEN AND PELVIS WITH CONTRAST IMPRESSION: 1. Intermittent distention of the colon, still within normal limits in caliber, with fluid filling the mid to distal sigmoid colon and rectum. Apparent  mild wall thickening along the mid sigmoid colon may reflect a mild infectious or inflammatory process. 2. Trace left pleural effusion, with mild bilateral peripheral scarring. 3. Diffuse calcification along the abdominal aorta and its branches, with mild thrombus along the distal descending thoracic aorta. 4. Nonspecific diffuse presacral stranding noted. 5. Left convex lumbar scoliosis noted.   Dg Abd 2 Views  11/08/2013 CLINICAL DATA: Abdominal distention EXAM: ABDOMEN - 2 VIEW IMPRESSION: Changes most consistent with an ileus. The overall appearance is stable from the previous exam.   Dg Abd 2 Views  11/07/2013 CLINICAL DATA: Abdominal distention and pain EXAM: ABDOMEN - 2 VIIEWS IMPRESSION: Findings again likely reflecting an ileus. There does appear to be slight decompression.   Dg Abd 2 Views  11/06/2013 CLINICAL DATA: Distended abdomen. EXAM: ABDOMEN - 2 VIEW IMPRESSION: Gas distended colon measuring up to 9.9 cm. This may represent an ileus related to inflammation although distal obstructing lesion not excluded. No free intraperitoneal air.   Dg Abd Acute W/chest  10/31/2013 CLINICAL DATA: Distended  abdomen. EXAM: ACUTE ABDOMEN SERIES (ABDOMEN 2 VIEW & CHEST 1 VIEW) COMPARISON:  IMPRESSION: 1. Moderate distension of the large bowel loops identified. Findings may reflect colonic ileus or distal bowel obstruction. Consider further assessment with CT of the abdomen and pelvis with oral contrast material. 2. Suspect mild CHF.   Dg Abd Portable 1v  11/03/2013 CLINICAL DATA: Abdominal distension question obstruction EXAM: PORTABLE ABDOMEN IMPRESSION: Nonspecific bowel gas pattern with air-filled loops of large and small bowel throughout abdomen. No definite evidence of obstruction or wall thickening.   11/14/13 CXR KUB patchy interstitial changes medial right lower lung consistent with pneumonia, mild cardiomegaly and slight elevation of the pulmonary vasculature suggests congestive heart failure.  Consistent with a mild adynamic ileus, a moderate amount of fecal material is present throughout the colon.   11/27/12 CXR consistent with mild congestive heart failure, the interstitial changes in the right lower lung are most consistent with pneumonia with minimal changes from 11/24/13.   Assessment/Plan Pneumonitis Relapsed as evidenced in fever, SOB, cough after completed total 14 days of Avelox. Repeated CXR showed RLL consistent with PNA-7 day course of Rocephin started 11/27/12. Fever resolved today-may consider blood culture is fever recurs. Also CXR 11/27/12 showed CHF. The patient has noted with increased BLE edema--Lasix 20mg  daily to be gently diuresed. Monitor the patient.     Pressure ulcer stage II The right heel 5cm x5cm-not infected, pressure reduction, hydrocolloid dressing q3-5 days.     Colon distention Distended abd and pain when palpated-resolved. Continue Reglan.  Patient does have abdominal distention with CT findings of infectious versus inflammation of admission. She continues to have loose BM. C DIFF per negative. Vomiting has resolved. Abdominal x-ray repeated on 12/8 shows distended bowel gas possibly ileus,. No obstruction seen. Surgery consulted recommended ambulation, replete K and prokinetic agents-will try Reglan 5mg  po ac and hs for now.  -she had a CT abd showed improvement in the colon distension.          Accelerated hypertension Controlled with mildly elevated SBP 140-150        CHF (congestive heart failure) CXR 11/27/12 showed CHF, increased BLE edema and SOB and moist rales bibasilar. Will gentle diurese her with Furosemide 20mg  along with Kcl daily. F/u BMP and BNP         Anemia, chronic disease Stable, Hgb 12s. Update CBC        Edema Worse BLE, Furosemide 20mg     Postherpetic neuralgia better controlled with Gabapentin 100mg  tid and able to decrease Hydrocodone to bid prn. Hold Gabapentin since the patient appears  drowsy.            Unspecified hypothyroidism Adequately supplemented with Levothyroxine , TSH 3.573 10/02/13                A-fib Rate controlled on Digoxin 0.125mg  and Atenolol 50mg  daily, Dig level 0.3 10/02/13                    Family/ Staff Communication: observe the patient.   Goals of Care: SNF  Labs/tests ordered: CBC and BMP and BNP

## 2013-12-01 LAB — CBC AND DIFFERENTIAL
HCT: 33 % — AB (ref 36–46)
HEMOGLOBIN: 11 g/dL — AB (ref 12.0–16.0)
Platelets: 236 10*3/uL (ref 150–399)
WBC: 9.2 10*3/mL

## 2013-12-01 LAB — BASIC METABOLIC PANEL
BUN: 21 mg/dL (ref 4–21)
Creatinine: 0.6 mg/dL (ref 0.5–1.1)
Glucose: 111 mg/dL
Potassium: 3.8 mmol/L (ref 3.4–5.3)
SODIUM: 142 mmol/L (ref 137–147)

## 2013-12-01 LAB — HEPATIC FUNCTION PANEL
ALK PHOS: 50 U/L (ref 25–125)
ALT: 29 U/L (ref 7–35)
AST: 32 U/L (ref 13–35)
BILIRUBIN, TOTAL: 0.5 mg/dL

## 2013-12-01 LAB — TSH: TSH: 5.68 u[IU]/mL (ref 0.41–5.90)

## 2013-12-02 ENCOUNTER — Encounter: Payer: Self-pay | Admitting: Nurse Practitioner

## 2013-12-02 NOTE — Progress Notes (Signed)
This encounter was created in error - please disregard.

## 2013-12-04 LAB — PROTIME-INR: Protime: 38.4 seconds — AB (ref 10.0–13.8)

## 2013-12-04 LAB — POCT INR: INR: 4.1 — AB (ref 0.9–1.1)

## 2013-12-05 ENCOUNTER — Non-Acute Institutional Stay (SKILLED_NURSING_FACILITY): Payer: Medicare Other | Admitting: Nurse Practitioner

## 2013-12-05 ENCOUNTER — Encounter: Payer: Self-pay | Admitting: Nurse Practitioner

## 2013-12-05 DIAGNOSIS — D638 Anemia in other chronic diseases classified elsewhere: Secondary | ICD-10-CM

## 2013-12-05 DIAGNOSIS — R609 Edema, unspecified: Secondary | ICD-10-CM

## 2013-12-05 DIAGNOSIS — J189 Pneumonia, unspecified organism: Secondary | ICD-10-CM

## 2013-12-05 DIAGNOSIS — E039 Hypothyroidism, unspecified: Secondary | ICD-10-CM

## 2013-12-05 DIAGNOSIS — I509 Heart failure, unspecified: Secondary | ICD-10-CM

## 2013-12-05 DIAGNOSIS — G609 Hereditary and idiopathic neuropathy, unspecified: Secondary | ICD-10-CM

## 2013-12-05 DIAGNOSIS — Z5181 Encounter for therapeutic drug level monitoring: Secondary | ICD-10-CM

## 2013-12-05 DIAGNOSIS — G629 Polyneuropathy, unspecified: Secondary | ICD-10-CM

## 2013-12-05 DIAGNOSIS — K6389 Other specified diseases of intestine: Secondary | ICD-10-CM

## 2013-12-05 DIAGNOSIS — E871 Hypo-osmolality and hyponatremia: Secondary | ICD-10-CM

## 2013-12-05 DIAGNOSIS — B0229 Other postherpetic nervous system involvement: Secondary | ICD-10-CM

## 2013-12-05 DIAGNOSIS — Z7901 Long term (current) use of anticoagulants: Secondary | ICD-10-CM

## 2013-12-05 DIAGNOSIS — I10 Essential (primary) hypertension: Secondary | ICD-10-CM

## 2013-12-05 DIAGNOSIS — E43 Unspecified severe protein-calorie malnutrition: Secondary | ICD-10-CM

## 2013-12-05 DIAGNOSIS — I4891 Unspecified atrial fibrillation: Secondary | ICD-10-CM

## 2013-12-05 NOTE — Assessment & Plan Note (Signed)
Controlled with mildly elevated SBP 140-150

## 2013-12-05 NOTE — Assessment & Plan Note (Signed)
Albumin 3 recently, difficulty with oral nutritional supplement.

## 2013-12-05 NOTE — Assessment & Plan Note (Signed)
Increased Levothyroxine from 125mcg to 150mcg due to trending up TSH, TSH 3.573 10/02/13 and 5.681 12/01/13, update TSH in 8 weeks.

## 2013-12-05 NOTE — Progress Notes (Signed)
Patient ID: Shirley Mccormick, female   DOB: 1925/11/18, 80 y.o.   MRN: [id number]   Code Status: DNR  Allergies  Allergen Reactions  . Erythromycin Rash  . Penicillins Rash  . Tetanus Toxoids Rash    Chief Complaint  Patient presents with  . Medical Managment of Chronic Issues    lethargy, postherpetic neuralgia, anticoagulation, hypothyroidism, CHF/edmea.     HPI: Patient is a 80 y.o. female seen in the SNF at [facility] today for evaluation of GERD, PNA, postherpetic neuralgia, hypothyroidism, hypercoagulation state, lethargy,  BLE edeam/CHF, and other chronic medical conditions.  Problem List Items Addressed This Visit   Hyponatremia - Primary     Resolved, serum Na 142 12/01/13    A-fib     Rate controlled on Digoxin 0.125mg  and Atenolol 50mg  daily, Dig level 0.3 10/02/13 and again 0.8 12/01/13                      Anticoagulation goal of INR 2 to 3     Hypercoagulation state, INR 4.09 12/04/12-hold Coumadin for 3 days then update PT/INR 12/08/13.     Unspecified hypothyroidism     Increased Levothyroxine from to due to trending up TSH, TSH 3.573 10/02/13 and 5.681 12/01/13, update TSH in 8 weeks.                     Relevant Medications      levothyroxine (SYNTHROID, LEVOTHROID) 150 MCG tablet   Peripheral neuropathy     Chronic and longstanding needle pricking pain in feet. Takes Tylenol routinely at home. Hx of Guillain Barre.  Dc Gabapentin due to questionable efficacy and possible cause of lethargy.             Relevant Medications      DULoxetine (CYMBALTA) 30 MG capsule   Postherpetic neuralgia     better controlled with Gabapentin 100mg  tid and able to decrease Hydrocodone to bid prn. Held Gabapentin since the patient appears drowsy-dc'd 12/02/13 due to questionable efficacy. Started Cymbalta 30mg -12/02/13-tolerated as far. Her lethargy persisted-will discourage Tylenol pm use for now and observe the patient.                  Edema     Better BLE, continue Furosemide 20mg , BNP 343.5 12/01/13.         Anemia, chronic disease     Hgb dropped from 11.9 10/31/13 to 11 12/01/13--continue to monitor the patient as well as labs.             CHF (congestive heart failure)     CXR 11/27/12 showed CHF, increased BLE edema and SOB and moist rales bibasilar. Compensated onFurosemide 20mg  along with Kcl daily.             Accelerated hypertension     Controlled with mildly elevated SBP 140-150          Colon distention     Distended abd and pain when palpated-resolved. Stable since Reglan was discontinued 12/02/13.  Patient does have abdominal distention with CT findings of infectious versus inflammation of admission. She continues to have loose BM. C DIFF per negative. Vomiting has resolved. Abdominal x-ray repeated on 12/8 shows distended bowel gas possibly ileus,. No obstruction seen. Surgery consulted recommended ambulation, replete K and prokinetic agents-will try Reglan 5mg  po ac and hs for now.  -she had a CT abd showed improvement in the colon distension. Dc unessential meds of  Fish oil and Glucosamine.              Pneumonitis     Fully treated and clinically resolved.         Protein-calorie malnutrition, severe     Albumin 3 recently, difficulty with oral nutritional supplement.        Review of Systems:  Review of Systems  Constitutional: Positive for malaise/fatigue. Negative for fever, chills, weight loss and diaphoresis.       Generalized and lower body weakness.   HENT: Positive for hearing loss. Negative for congestion, ear discharge, ear pain, nosebleeds, sore throat and tinnitus.   Eyes: Negative for blurred vision, double vision, photophobia, pain, discharge and redness.  Respiratory: Positive for cough and shortness of breath. Negative for hemoptysis, sputum production, wheezing and stridor.   Cardiovascular: Positive for leg  swelling. Negative for chest pain, palpitations, orthopnea, claudication and PND.       BLE  Gastrointestinal: Negative for heartburn, nausea, vomiting, abdominal pain, diarrhea, constipation, blood in stool and melena.       Distended abd  Genitourinary: Positive for frequency. Negative for dysuria, urgency, hematuria and flank pain.  Musculoskeletal: Positive for back pain. Negative for falls, joint pain, myalgias and neck pain.       From her left shingle site travels to her left back.   Skin: Positive for rash.       Left flank and abd shingle lesions healing nicely. R heel stage II pressure ulcer not infected.   Neurological: Positive for sensory change and weakness. Negative for dizziness, tingling, tremors, speech change, focal weakness, seizures, loss of consciousness and headaches.       BLE needle pricking. Left flank postherpetic neuralgia.   Endo/Heme/Allergies: Negative for environmental allergies and polydipsia. Bruises/bleeds easily.  Psychiatric/Behavioral: Positive for memory loss. Negative for depression, suicidal ideas, hallucinations and substance abuse. The patient is not nervous/anxious and does not have insomnia.        Mild confusion occasionally.      Past Medical History  Diagnosis Date  . Afib   . Shingles 10/01/13  . Pulmonary embolism 1990  . Guillain-Barre 1990    resolved  . Unspecified hypothyroidism   . Neuropathy    Past Surgical History  Procedure Laterality Date  . Breast lumpectomy Right 1965    Dr. Judithe Modest   Social History:   reports that she has never smoked. She does not have any smokeless tobacco history on file. She reports that she does not drink alcohol or use illicit drugs.  Medications: Patient's Medications  New Prescriptions   No medications on file  Previous Medications   ACETAMINOPHEN (TYLENOL) 325 MG TABLET    Take 650 mg by mouth every 6 (six) hours as needed for mild pain.   AMLODIPINE (NORVASC) 5 MG TABLET    Take 1  tablet (5 mg total) by mouth daily.   ATENOLOL (TENORMIN) 50 MG TABLET    Take 50 mg by mouth daily.   CHOLECALCIFEROL (VITAMIN D) 1000 UNITS TABLET    Take 1,000 Units by mouth every morning.    DIGOXIN (LANOXIN) 0.125 MG TABLET    Take 0.125 mg by mouth every morning.    DIPHENHYDRAMINE-ACETAMINOPHEN (TYLENOL PM) 25-500 MG TABS    Take 2 tablets by mouth at bedtime.   DULOXETINE (CYMBALTA) 30 MG CAPSULE    Take 30 mg by mouth daily.   FEEDING SUPPLEMENT, ENSURE COMPLETE, (ENSURE COMPLETE) LIQD    Take 237 mLs by  mouth 2 (two) times daily between meals.   HYDRALAZINE (APRESOLINE) 25 MG TABLET    Take 1 tablet (25 mg total) by mouth every 8 (eight) hours.   IPRATROPIUM-ALBUTEROL (DUONEB) 0.5-2.5 (3) MG/3ML SOLN    Take 3 mLs by nebulization 3 (three) times daily. For 3 days only. START 12.3.14 END 12.5.14   LEVOTHYROXINE (SYNTHROID, LEVOTHROID) 150 MCG TABLET    Take 150 mcg by mouth daily before breakfast.   MULTIPLE VITAMINS-MINERALS (CERTAVITE/ANTIOXIDANTS PO)    Take 1 tablet by mouth every morning.   WARFARIN (COUMADIN) 1 MG TABLET    Take 7 mg by mouth daily at 6 PM.   WARFARIN (COUMADIN) 6 MG TABLET    Take 6 mg by mouth daily at 6 PM.  Modified Medications   No medications on file  Discontinued Medications   FISH OIL-OMEGA-3 FATTY ACIDS 1000 MG CAPSULE    Take 1 g by mouth 2 (two) times daily.    GABAPENTIN (NEURONTIN) 100 MG CAPSULE    Take 100 mg by mouth 3 (three) times daily.   GLUCOSAMINE-CHONDROITIN 500-400 MG TABLET    Take 1 tablet by mouth 3 (three) times daily.   LEVOTHYROXINE (SYNTHROID, LEVOTHROID) 125 MCG TABLET    Take 125 mcg by mouth daily.   METOCLOPRAMIDE (REGLAN) 5 MG TABLET    Take 5 mg by mouth 4 (four) times daily -  before meals and at bedtime.     Physical Exam: Physical Exam  Constitutional: She is oriented to person, place, and time. She appears well-developed and well-nourished. No distress.  HENT:  Head: Normocephalic and atraumatic.  Right Ear:  External ear normal.  Left Ear: External ear normal.  Nose: Nose normal.  Mouth/Throat: Oropharynx is clear and moist. No oropharyngeal exudate.  Eyes: Conjunctivae and EOM are normal. Pupils are equal, round, and reactive to light. Right eye exhibits no discharge. Left eye exhibits no discharge. No scleral icterus.  Neck: Normal range of motion. Neck supple. No JVD present. No tracheal deviation present. No thyromegaly present.  Cardiovascular: Normal rate, regular rhythm, normal heart sounds and intact distal pulses.   No murmur heard. Pulmonary/Chest: Effort normal. No stridor. No respiratory distress. She has no wheezes. She has rales.  Moist rales bibasilar.   Abdominal: Soft. She exhibits distension. Bowel sounds are increased. There is no tenderness. There is no rebound and no guarding.  Musculoskeletal: Normal range of motion. She exhibits edema. She exhibits no tenderness.  Unable to stand on her own. BLE edema trace  Lymphadenopathy:    She has no cervical adenopathy.  Neurological: She is alert and oriented to person, place, and time. She has normal reflexes. No cranial nerve deficit. She exhibits normal muscle tone. Coordination normal.  Skin: Skin is warm and dry. Rash noted. She is not diaphoretic. There is erythema.  Shingles left flank and abd-healing nicely. R heel stage II pressure ulcer not infected.    Psychiatric: She has a normal mood and affect. Her behavior is normal. Judgment and thought content normal.    Filed Vitals:   12/05/13 1244  BP: 150/72  Pulse: 86  Temp: 97.5 F (36.4 C)  TempSrc: Tympanic  Resp: 16      Labs reviewed: Basic Metabolic Panel:  Recent Labs  16/10/96 0522 11/01/13 1610  11/08/13 0538 11/09/13 0440 11/10/13 0615 11/13/13 12/01/13  NA 121*  --   < > 138 138 143 144 142  K 3.6  --   < > 3.8 3.7 4.2 3.6  3.8  CL 83*  --   < > 99 97 101  --   --   CO2 23  --   < > 29 28 30   --   --   GLUCOSE 115*  --   < > 122* 122* 127*  --    --   BUN 10  --   < > 17 21 26* 24* 21  CREATININE 0.54  --   < > 0.59 0.62 0.72 0.7 0.6  CALCIUM 8.7  --   < > 8.7 8.7 9.1  --   --   TSH  --  3.021  --   --   --   --   --  5.68  < > = values in this interval not displayed. Liver Function Tests:  Recent Labs  10/01/13 1301 11/01/13 0522 12/01/13  AST 31 23 32  ALT 21 13 29   ALKPHOS 47 42 50  BILITOT 0.4 0.7  --   PROT 7.6 6.8  --   ALBUMIN 3.7 3.1*  --    CBC:  Recent Labs  10/01/13 1301  10/31/13 1845  11/01/13 0522 11/06/13 0526 11/08/13 0538 12/01/13  WBC 5.2  < > 7.5  --  8.0 9.8 11.6* 9.2  NEUTROABS 3.0  --  5.2  --   --   --   --   --   HGB 13.5  < > 12.4  < > 12.6 11.9* 13.1 11.0*  HCT 39.3  < > 35.0*  < > 35.7* 34.4* 37.6 33*  MCV 87.5  --  88.2  --  88.4 87.8 90.2  --   PLT 187  < > 222  --  197 254 307 236  < > = values in this interval not displayed.  Past Procedures:  Ct Head Wo Contrast  10/31/2013 CLINICAL DATA: Projectile vomiting this morning. Fell 3 days ago. EXAM: CT HEAD WITHOUT CONTRAST CT CERVICAL SPINE WITHOUT CONTRAST: IMPRESSION: 1. Acute left sphenoid sinusitis vs. blood in the sinus. 2. No skull fracture or intracranial hemorrhage seen. 3. No cervical spine fracture or traumatic subluxation. 4. Multilevel cervical spine degenerative changes. 5. Atrophy and chronic small vessel white matter ischemic changes in both cerebral hemispheres.   Ct Cervical Spine Wo Contrast  10/31/2013 CLINICAL DATA: Projectile vomiting this morning. Fell 3 days ago. EXAM: CT HEAD WITHOUT CONTRAST CT CERVICAL SPINE WITHOUT CONTRAST IMPRESSION: 1. Acute left sphenoid sinusitis vs. blood in the sinus. 2. No skull fracture or intracranial hemorrhage seen. 3. No cervical spine fracture or traumatic subluxation. 4. Multilevel cervical spine degenerative changes. 5. Atrophy and chronic small vessel white matter ischemic changes in both cerebral hemispheres.   Ct Abdomen Pelvis W Contrast  11/10/2013 CLINICAL DATA: Followup to  abdominal CT performed on 10/31/2013 showing wall thickening of the mid sigmoid colon. EXAM: CT ABDOMEN AND PELVIS WITH CONTRAST IMPRESSION: 1. There is now no evidence of colonic inflammation. There is no bowel wall thickening. The appearance on the current exam is consistent with a diffuse colonic adynamic ileus with mild distention of the colon and rectum and air-fluid levels. 2. No obstruction. The stomach is decompressed and the small bowel normal in caliber. 3. Cardiomegaly, lung base scarring/subsegmental atelectasis and small left effusion, stable from the prior study. 4. Chronic findings in the abdomen and pelvis as detailed above all stable.   Ct Abdomen Pelvis W Contrast  10/31/2013 CLINICAL DATA: Abdominal distention, vomiting and fall. Altered mental status. EXAM: CT ABDOMEN AND PELVIS  WITH CONTRAST IMPRESSION: 1. Intermittent distention of the colon, still within normal limits in caliber, with fluid filling the mid to distal sigmoid colon and rectum. Apparent mild wall thickening along the mid sigmoid colon may reflect a mild infectious or inflammatory process. 2. Trace left pleural effusion, with mild bilateral peripheral scarring. 3. Diffuse calcification along the abdominal aorta and its branches, with mild thrombus along the distal descending thoracic aorta. 4. Nonspecific diffuse presacral stranding noted. 5. Left convex lumbar scoliosis noted.   Dg Abd 2 Views  11/08/2013 CLINICAL DATA: Abdominal distention EXAM: ABDOMEN - 2 VIEW IMPRESSION: Changes most consistent with an ileus. The overall appearance is stable from the previous exam.   Dg Abd 2 Views  11/07/2013 CLINICAL DATA: Abdominal distention and pain EXAM: ABDOMEN - 2 VIIEWS IMPRESSION: Findings again likely reflecting an ileus. There does appear to be slight decompression.   Dg Abd 2 Views  11/06/2013 CLINICAL DATA: Distended abdomen. EXAM: ABDOMEN - 2 VIEW IMPRESSION: Gas distended colon measuring up to 9.9 cm. This may  represent an ileus related to inflammation although distal obstructing lesion not excluded. No free intraperitoneal air.   Dg Abd Acute W/chest  10/31/2013 CLINICAL DATA: Distended abdomen. EXAM: ACUTE ABDOMEN SERIES (ABDOMEN 2 VIEW & CHEST 1 VIEW) COMPARISON:  IMPRESSION: 1. Moderate distension of the large bowel loops identified. Findings may reflect colonic ileus or distal bowel obstruction. Consider further assessment with CT of the abdomen and pelvis with oral contrast material. 2. Suspect mild CHF.   Dg Abd Portable 1v  11/03/2013 CLINICAL DATA: Abdominal distension question obstruction EXAM: PORTABLE ABDOMEN IMPRESSION: Nonspecific bowel gas pattern with air-filled loops of large and small bowel throughout abdomen. No definite evidence of obstruction or wall thickening.   11/14/13 CXR KUB patchy interstitial changes medial right lower lung consistent with pneumonia, mild cardiomegaly and slight elevation of the pulmonary vasculature suggests congestive heart failure. Consistent with a mild adynamic ileus, a moderate amount of fecal material is present throughout the colon.   11/27/12 CXR consistent with mild congestive heart failure, the interstitial changes in the right lower lung are most consistent with pneumonia with minimal changes from 11/24/13.   Assessment/Plan Hyponatremia Resolved, serum Na 142 12/01/13  A-fib Rate controlled on Digoxin 0.125mg  and Atenolol 50mg  daily, Dig level 0.3 10/02/13 and again 0.8 12/01/13                    Anticoagulation goal of INR 2 to 3 Hypercoagulation state, INR 4.09 12/04/12-hold Coumadin for 3 days then update PT/INR 12/08/13.   Unspecified hypothyroidism Increased Levothyroxine from to due to trending up TSH, TSH 3.573 10/02/13 and 5.681 12/01/13, update TSH in 8 weeks.                   Peripheral neuropathy Chronic and longstanding needle pricking pain in feet. Takes Tylenol routinely at home. Hx of  Guillain Barre.  Dc Gabapentin due to questionable efficacy and possible cause of lethargy.           Postherpetic neuralgia better controlled with Gabapentin 100mg  tid and able to decrease Hydrocodone to bid prn. Held Gabapentin since the patient appears drowsy-dc'd 12/02/13 due to questionable efficacy. Started Cymbalta 30mg -12/02/13-tolerated as far. Her lethargy persisted-will discourage Tylenol pm use for now and observe the patient.               Edema Better BLE, continue Furosemide 20mg , BNP 343.5 12/01/13.       Anemia, chronic  disease Hgb dropped from 11.9 10/31/13 to 11 12/01/13--continue to monitor the patient as well as labs.           CHF (congestive heart failure) CXR 11/27/12 showed CHF, increased BLE edema and SOB and moist rales bibasilar. Compensated onFurosemide 20mg  along with Kcl daily.           Accelerated hypertension Controlled with mildly elevated SBP 140-150        Pneumonitis Fully treated and clinically resolved.       Colon distention Distended abd and pain when palpated-resolved. Stable since Reglan was discontinued 12/02/13.  Patient does have abdominal distention with CT findings of infectious versus inflammation of admission. She continues to have loose BM. C DIFF per negative. Vomiting has resolved. Abdominal x-ray repeated on 12/8 shows distended bowel gas possibly ileus,. No obstruction seen. Surgery consulted recommended ambulation, replete K and prokinetic agents-will try Reglan 5mg  po ac and hs for now.  -she had a CT abd showed improvement in the colon distension. Dc unessential meds of Fish oil and Glucosamine.            Protein-calorie malnutrition, severe Albumin 3 recently, difficulty with oral nutritional supplement.     Family/ Staff Communication: observe the patient.   Goals of Care: SNF  Labs/tests ordered: none.

## 2013-12-05 NOTE — Assessment & Plan Note (Signed)
Better BLE, continue Furosemide 20mg , BNP 343.5 12/01/13.

## 2013-12-05 NOTE — Assessment & Plan Note (Signed)
Resolved, serum Na 142 12/01/13

## 2013-12-05 NOTE — Assessment & Plan Note (Signed)
CXR 11/27/12 showed CHF, increased BLE edema and SOB and moist rales bibasilar. Compensated onFurosemide 20mg  along with Kcl 10meq daily.

## 2013-12-05 NOTE — Assessment & Plan Note (Signed)
Rate controlled on Digoxin 0.125mg and Atenolol 50mg daily, Dig level 0.3 10/02/13 and again 0.8 12/01/13                     

## 2013-12-05 NOTE — Assessment & Plan Note (Signed)
better controlled with Gabapentin 100mg  tid and able to decrease Hydrocodone to bid prn. Held Gabapentin since the patient appears drowsy-dc'd 12/02/13 due to questionable efficacy. Started Cymbalta 30mg -12/02/13-tolerated as far. Her lethargy persisted-will discourage Tylenol pm use for now and observe the patient.

## 2013-12-05 NOTE — Assessment & Plan Note (Signed)
Fully treated and clinically resolved.  

## 2013-12-05 NOTE — Assessment & Plan Note (Signed)
Distended abd and pain when palpated-resolved. Stable since Reglan was discontinued 12/02/13.  Patient does have abdominal distention with CT findings of infectious versus inflammation of admission. She continues to have loose BM. C DIFF per negative. Vomiting has resolved. Abdominal x-ray repeated on 12/8 shows distended bowel gas possibly ileus,. No obstruction seen. Surgery consulted recommended ambulation, replete K and prokinetic agents-will try Reglan 5mg  po ac and hs for now.  -she had a CT abd showed improvement in the colon distension. Dc unessential meds of Fish oil and Glucosamine.

## 2013-12-05 NOTE — Assessment & Plan Note (Signed)
Chronic and longstanding needle pricking pain in feet. Takes Tylenol routinely at home. Hx of Guillain Barre.  Dc Gabapentin due to questionable efficacy and possible cause of lethargy.

## 2013-12-05 NOTE — Assessment & Plan Note (Signed)
Hgb dropped from 11.9 10/31/13 to 11 12/01/13--continue to monitor the patient as well as labs.            

## 2013-12-05 NOTE — Assessment & Plan Note (Signed)
Hypercoagulation state, INR 4.09 12/04/12-hold Coumadin for 3 days then update PT/INR 12/08/13.

## 2013-12-09 ENCOUNTER — Non-Acute Institutional Stay (SKILLED_NURSING_FACILITY): Payer: Medicare Other

## 2013-12-09 DIAGNOSIS — I10 Essential (primary) hypertension: Secondary | ICD-10-CM

## 2013-12-09 DIAGNOSIS — R627 Adult failure to thrive: Secondary | ICD-10-CM

## 2013-12-09 DIAGNOSIS — L8990 Pressure ulcer of unspecified site, unspecified stage: Secondary | ICD-10-CM

## 2013-12-09 DIAGNOSIS — L8992 Pressure ulcer of unspecified site, stage 2: Secondary | ICD-10-CM

## 2013-12-09 DIAGNOSIS — R29898 Other symptoms and signs involving the musculoskeletal system: Secondary | ICD-10-CM

## 2013-12-09 DIAGNOSIS — K6389 Other specified diseases of intestine: Secondary | ICD-10-CM

## 2013-12-09 DIAGNOSIS — R413 Other amnesia: Secondary | ICD-10-CM

## 2013-12-09 DIAGNOSIS — R609 Edema, unspecified: Secondary | ICD-10-CM

## 2013-12-09 DIAGNOSIS — B0229 Other postherpetic nervous system involvement: Secondary | ICD-10-CM

## 2013-12-09 DIAGNOSIS — R29818 Other symptoms and signs involving the nervous system: Secondary | ICD-10-CM

## 2013-12-09 DIAGNOSIS — I4891 Unspecified atrial fibrillation: Secondary | ICD-10-CM

## 2013-12-09 DIAGNOSIS — D638 Anemia in other chronic diseases classified elsewhere: Secondary | ICD-10-CM

## 2013-12-09 DIAGNOSIS — I509 Heart failure, unspecified: Secondary | ICD-10-CM

## 2013-12-09 DIAGNOSIS — E039 Hypothyroidism, unspecified: Secondary | ICD-10-CM

## 2013-12-09 NOTE — Assessment & Plan Note (Addendum)
Better BLE, continue Furosemide 20mg , BNP 343.5 12/01/13. Update BMP

## 2013-12-09 NOTE — Progress Notes (Signed)
Patient ID: Shirley Mccormick, female   DOB: 10-07-1925, 80 y.o.   MRN: [id number]   Code Status: DNR  Allergies  Allergen Reactions  . Erythromycin Rash  . Penicillins Rash  . Tetanus Toxoids Rash    Chief Complaint  Patient presents with  . Medical Managment of Chronic Issues    thyroid, A-Fib,  ulcer on left heel    HPI: Patient is a 80 y.o. female seen in the SNF at [facility] today for evaluation of GERD, postherpetic neuralgia, hypothyroidism, hypercoagulation state, lethargy,  BLE edeam/CHF, and other chronic medical conditions.  Problem List Items Addressed This Visit   Muscular deconditioning     Persisted. Comfort measures per POA-husband. Hospice Referral.     A-fib     Rate controlled on Digoxin 0.125mg  and Atenolol 50mg  daily, Dig level 0.3 10/02/13 and again 0.8 12/01/13                        Relevant Medications      warfarin (COUMADIN) 5 MG tablet   Unspecified hypothyroidism     Increased Levothyroxine from 125mcg to 150mcg due to trending up TSH, TSH 3.573 10/02/13 and 5.681 12/01/13, update TSH in 8 weeks.                       Postherpetic neuralgia     Questionable with Gabapentin and  Cymbalta 30mg -12/02/13-off Gabapentin, dc Cymbalta--no efficacy in mood or pain, try Norco tid for pain.                   Memory impairment     Continue to decline.     Edema     Better BLE, continue Furosemide 20mg , BNP 343.5 12/01/13. Update BMP          Anemia, chronic disease     Hgb dropped from 11.9 10/31/13 to 11 12/01/13--continue to monitor the patient as well as labs.               CHF (congestive heart failure)     CXR 11/27/12 showed CHF, bettter BLE edema and SOB and moist rales bibasilar. Compensated on Furosemide 20mg  along with Kcl 10meq daily. BNP 343.5 12/01/13              Accelerated hypertension     Controlled with mildly elevated SBP 140-150, takes Amlodipine 5mg , Atenolol 50mg   daily, Hydralazine 25mg  tid, Lasix 20mg              Colon distention     Distended abd and pain when palpated-resolved. Stable since Reglan was discontinued 12/02/13.  Patient does have abdominal distention with CT findings of infectious versus inflammation of admission. She continues to have loose BM. C DIFF per negative. Vomiting has resolved. Abdominal x-ray repeated on 12/8 shows distended bowel gas possibly ileus,. No obstruction seen. Surgery consulted recommended ambulation, replete K and prokinetic agents-fao;ed Reglan 5mg  po ac and hs for now.  -she had a CT abd showed improvement in the colon distension.               Pressure ulcer stage II     The right heel 5cm x5cm-not infected, pressure reduction, hydrocolloid dressing q3-5 days.       Failure to thrive in adult - Primary     Declined in cognition, mood, physical aspects. Will refer to Hospice. Comfort measures: Norco tid for now.        Review  of Systems:  Review of Systems  Constitutional: Positive for malaise/fatigue. Negative for fever, chills, weight loss and diaphoresis.       Generalized and lower body weakness. Lethargic-occasionally smile, open her eyes, or yes or no answers to simple questions.   HENT: Positive for hearing loss. Negative for congestion, ear discharge, ear pain, nosebleeds, sore throat and tinnitus.   Eyes: Negative for blurred vision, double vision, photophobia, pain, discharge and redness.  Respiratory: Positive for cough and shortness of breath. Negative for hemoptysis, sputum production, wheezing and stridor.   Cardiovascular: Positive for leg swelling. Negative for chest pain, palpitations, orthopnea, claudication and PND.       BLE  Gastrointestinal: Negative for heartburn, nausea, vomiting, abdominal pain, diarrhea, constipation, blood in stool and melena.       Distended abd  Genitourinary: Positive for frequency. Negative for dysuria, urgency, hematuria and flank pain.    Musculoskeletal: Positive for back pain. Negative for falls, joint pain, myalgias and neck pain.       From her left shingle site travels to her left back.   Skin: Positive for rash.       Left flank and abd shingle lesions healing nicely. R heel stage II pressure ulcer not infected.   Neurological: Positive for sensory change and weakness. Negative for dizziness, tingling, tremors, speech change, focal weakness, seizures, loss of consciousness and headaches.       BLE needle pricking. Left flank postherpetic neuralgia.   Endo/Heme/Allergies: Negative for environmental allergies and polydipsia. Bruises/bleeds easily.  Psychiatric/Behavioral: Positive for memory loss. Negative for depression, suicidal ideas, hallucinations and substance abuse. The patient is not nervous/anxious and does not have insomnia.        Mild confusion occasionally.      Past Medical History  Diagnosis Date  . Afib   . Shingles 10/01/13  . Pulmonary embolism 1990  . Guillain-Barre 1990    resolved  . Unspecified hypothyroidism   . Neuropathy   . Muscle weakness (generalized)    Past Surgical History  Procedure Laterality Date  . Breast lumpectomy Right 1965    Dr. Judithe Modest   Social History:   reports that she has never smoked. She does not have any smokeless tobacco history on file. She reports that she does not drink alcohol or use illicit drugs.  Medications: Patient's Medications  New Prescriptions   No medications on file  Previous Medications   ACETAMINOPHEN (TYLENOL) 325 MG TABLET    Take 650 mg by mouth every 6 (six) hours as needed for mild pain.   AMLODIPINE (NORVASC) 5 MG TABLET    Take 1 tablet (5 mg total) by mouth daily.   ATENOLOL (TENORMIN) 50 MG TABLET    Take 50 mg by mouth daily.   CHOLECALCIFEROL (VITAMIN D) 1000 UNITS TABLET    Take 1,000 Units by mouth every morning.    DIGOXIN (LANOXIN) 0.125 MG TABLET    Take 0.125 mg by mouth every morning.    DULOXETINE (CYMBALTA) 30 MG  CAPSULE    Take 30 mg by mouth daily.   FEEDING SUPPLEMENT, ENSURE COMPLETE, (ENSURE COMPLETE) LIQD    Take 237 mLs by mouth 2 (two) times daily between meals.   HYDRALAZINE (APRESOLINE) 25 MG TABLET    Take 1 tablet (25 mg total) by mouth every 8 (eight) hours.   HYDROCODONE-ACETAMINOPHEN (NORCO/VICODIN) 5-325 MG PER TABLET    Take one twice daily as needed for pain   IPRATROPIUM-ALBUTEROL (DUONEB) 0.5-2.5 (3) MG/3ML SOLN  Take 3 mLs by nebulization 3 (three) times daily. For 3 days only. START 12.3.14 END 12.5.14   LEVOTHYROXINE (SYNTHROID, LEVOTHROID) 150 MCG TABLET    Take 150 mcg by mouth daily before breakfast.   MOXIFLOXACIN (AVELOX) 400 MG TABLET    Take 400 mg by mouth. Take one daily for 14 days stop after 12/11/13   WARFARIN (COUMADIN) 5 MG TABLET    Take 5 mg by mouth daily.  Modified Medications   No medications on file  Discontinued Medications   DIPHENHYDRAMINE-ACETAMINOPHEN (TYLENOL PM) 25-500 MG TABS    Take 2 tablets by mouth at bedtime.   MULTIPLE VITAMINS-MINERALS (CERTAVITE/ANTIOXIDANTS PO)    Take 1 tablet by mouth every morning.   WARFARIN (COUMADIN) 1 MG TABLET    Take 7 mg by mouth daily at 6 PM.   WARFARIN (COUMADIN) 6 MG TABLET    Take 6 mg by mouth daily at 6 PM.     Physical Exam: Physical Exam  Constitutional: She is oriented to person, place, and time. She appears well-developed and well-nourished. No distress.  HENT:  Head: Normocephalic and atraumatic.  Right Ear: External ear normal.  Left Ear: External ear normal.  Nose: Nose normal.  Mouth/Throat: Oropharynx is clear and moist. No oropharyngeal exudate.  Eyes: Conjunctivae and EOM are normal. Pupils are equal, round, and reactive to light. Right eye exhibits no discharge. Left eye exhibits no discharge. No scleral icterus.  Neck: Normal range of motion. Neck supple. No JVD present. No tracheal deviation present. No thyromegaly present.  Cardiovascular: Normal rate, regular rhythm, normal heart sounds  and intact distal pulses.   No murmur heard. Pulmonary/Chest: Effort normal. No stridor. No respiratory distress. She has no wheezes. She has rales.  Moist rales bibasilar.   Abdominal: Soft. She exhibits distension. Bowel sounds are increased. There is no tenderness. There is no rebound and no guarding.  Musculoskeletal: Normal range of motion. She exhibits edema. She exhibits no tenderness.  Unable to stand on her own. BLE edema trace  Lymphadenopathy:    She has no cervical adenopathy.  Neurological: She is alert and oriented to person, place, and time. She has normal reflexes. No cranial nerve deficit. She exhibits normal muscle tone. Coordination normal.  Skin: Skin is warm and dry. Rash noted. She is not diaphoretic. There is erythema.  Shingles left flank and abd-healing nicely. R heel stage II pressure ulcer not infected.    Psychiatric: She has a normal mood and affect. Her behavior is normal. Judgment and thought content normal.    Filed Vitals:   12/09/13 1218  BP: 152/88  Pulse: 82  Temp: 98.6 F (37 C)  Resp: 20  SpO2: 93%      Labs reviewed: Basic Metabolic Panel:  Recent Labs  62/95/28 0522 11/01/13 1610  11/08/13 0538 11/09/13 0440 11/10/13 0615 11/13/13 12/01/13  NA 121*  --   < > 138 138 143 144 142  K 3.6  --   < > 3.8 3.7 4.2 3.6 3.8  CL 83*  --   < > 99 97 101  --   --   CO2 23  --   < > 29 28 30   --   --   GLUCOSE 115*  --   < > 122* 122* 127*  --   --   BUN 10  --   < > 17 21 26* 24* 21  CREATININE 0.54  --   < > 0.59 0.62 0.72 0.7 0.6  CALCIUM 8.7  --   < > 8.7 8.7 9.1  --   --   TSH  --  3.021  --   --   --   --   --  5.68  < > = values in this interval not displayed. Liver Function Tests:  Recent Labs  10/01/13 1301 11/01/13 0522 12/01/13  AST 31 23 32  ALT 21 13 29   ALKPHOS 47 42 50  BILITOT 0.4 0.7  --   PROT 7.6 6.8  --   ALBUMIN 3.7 3.1*  --    CBC:  Recent Labs  10/01/13 1301  10/31/13 1845  11/01/13 0522 11/06/13 0526  11/08/13 0538 12/01/13  WBC 5.2  < > 7.5  --  8.0 9.8 11.6* 9.2  NEUTROABS 3.0  --  5.2  --   --   --   --   --   HGB 13.5  < > 12.4  < > 12.6 11.9* 13.1 11.0*  HCT 39.3  < > 35.0*  < > 35.7* 34.4* 37.6 33*  MCV 87.5  --  88.2  --  88.4 87.8 90.2  --   PLT 187  < > 222  --  197 254 307 236  < > = values in this interval not displayed.  Past Procedures:  Ct Head Wo Contrast  10/31/2013 CLINICAL DATA: Projectile vomiting this morning. Fell 3 days ago. EXAM: CT HEAD WITHOUT CONTRAST CT CERVICAL SPINE WITHOUT CONTRAST: IMPRESSION: 1. Acute left sphenoid sinusitis vs. blood in the sinus. 2. No skull fracture or intracranial hemorrhage seen. 3. No cervical spine fracture or traumatic subluxation. 4. Multilevel cervical spine degenerative changes. 5. Atrophy and chronic small vessel white matter ischemic changes in both cerebral hemispheres.   Ct Cervical Spine Wo Contrast  10/31/2013 CLINICAL DATA: Projectile vomiting this morning. Fell 3 days ago. EXAM: CT HEAD WITHOUT CONTRAST CT CERVICAL SPINE WITHOUT CONTRAST IMPRESSION: 1. Acute left sphenoid sinusitis vs. blood in the sinus. 2. No skull fracture or intracranial hemorrhage seen. 3. No cervical spine fracture or traumatic subluxation. 4. Multilevel cervical spine degenerative changes. 5. Atrophy and chronic small vessel white matter ischemic changes in both cerebral hemispheres.   Ct Abdomen Pelvis W Contrast  11/10/2013 CLINICAL DATA: Followup to abdominal CT performed on 10/31/2013 showing wall thickening of the mid sigmoid colon. EXAM: CT ABDOMEN AND PELVIS WITH CONTRAST IMPRESSION: 1. There is now no evidence of colonic inflammation. There is no bowel wall thickening. The appearance on the current exam is consistent with a diffuse colonic adynamic ileus with mild distention of the colon and rectum and air-fluid levels. 2. No obstruction. The stomach is decompressed and the small bowel normal in caliber. 3. Cardiomegaly, lung base  scarring/subsegmental atelectasis and small left effusion, stable from the prior study. 4. Chronic findings in the abdomen and pelvis as detailed above all stable.   Ct Abdomen Pelvis W Contrast  10/31/2013 CLINICAL DATA: Abdominal distention, vomiting and fall. Altered mental status. EXAM: CT ABDOMEN AND PELVIS WITH CONTRAST IMPRESSION: 1. Intermittent distention of the colon, still within normal limits in caliber, with fluid filling the mid to distal sigmoid colon and rectum. Apparent mild wall thickening along the mid sigmoid colon may reflect a mild infectious or inflammatory process. 2. Trace left pleural effusion, with mild bilateral peripheral scarring. 3. Diffuse calcification along the abdominal aorta and its branches, with mild thrombus along the distal descending thoracic aorta. 4. Nonspecific diffuse presacral stranding noted. 5. Left convex lumbar scoliosis noted.  Dg Abd 2 Views  11/08/2013 CLINICAL DATA: Abdominal distention EXAM: ABDOMEN - 2 VIEW IMPRESSION: Changes most consistent with an ileus. The overall appearance is stable from the previous exam.   Dg Abd 2 Views  11/07/2013 CLINICAL DATA: Abdominal distention and pain EXAM: ABDOMEN - 2 VIIEWS IMPRESSION: Findings again likely reflecting an ileus. There does appear to be slight decompression.   Dg Abd 2 Views  11/06/2013 CLINICAL DATA: Distended abdomen. EXAM: ABDOMEN - 2 VIEW IMPRESSION: Gas distended colon measuring up to 9.9 cm. This may represent an ileus related to inflammation although distal obstructing lesion not excluded. No free intraperitoneal air.   Dg Abd Acute W/chest  10/31/2013 CLINICAL DATA: Distended abdomen. EXAM: ACUTE ABDOMEN SERIES (ABDOMEN 2 VIEW & CHEST 1 VIEW) COMPARISON:  IMPRESSION: 1. Moderate distension of the large bowel loops identified. Findings may reflect colonic ileus or distal bowel obstruction. Consider further assessment with CT of the abdomen and pelvis with oral contrast material. 2. Suspect  mild CHF.   Dg Abd Portable 1v  11/03/2013 CLINICAL DATA: Abdominal distension question obstruction EXAM: PORTABLE ABDOMEN IMPRESSION: Nonspecific bowel gas pattern with air-filled loops of large and small bowel throughout abdomen. No definite evidence of obstruction or wall thickening.   11/14/13 CXR KUB patchy interstitial changes medial right lower lung consistent with pneumonia, mild cardiomegaly and slight elevation of the pulmonary vasculature suggests congestive heart failure. Consistent with a mild adynamic ileus, a moderate amount of fecal material is present throughout the colon.   11/27/12 CXR consistent with mild congestive heart failure, the interstitial changes in the right lower lung are most consistent with pneumonia with minimal changes from 11/24/13.   Assessment/Plan Failure to thrive in adult Declined in cognition, mood, physical aspects. Will refer to Hospice. Comfort measures: Norco tid for now.   Pressure ulcer stage II The right heel 5cm x5cm-not infected, pressure reduction, hydrocolloid dressing q3-5 days.     Colon distention Distended abd and pain when palpated-resolved. Stable since Reglan was discontinued 12/02/13.  Patient does have abdominal distention with CT findings of infectious versus inflammation of admission. She continues to have loose BM. C DIFF per negative. Vomiting has resolved. Abdominal x-ray repeated on 12/8 shows distended bowel gas possibly ileus,. No obstruction seen. Surgery consulted recommended ambulation, replete K and prokinetic agents-fao;ed Reglan 5mg  po ac and hs for now.  -she had a CT abd showed improvement in the colon distension.             Accelerated hypertension Controlled with mildly elevated SBP 140-150, takes Amlodipine 5mg , Atenolol 50mg  daily, Hydralazine 25mg  tid, Lasix 20mg            CHF (congestive heart failure) CXR 11/27/12 showed CHF, bettter BLE edema and SOB and moist rales bibasilar. Compensated on  Furosemide 20mg  along with Kcl daily. BNP 343.5 12/01/13            Anemia, chronic disease Hgb dropped from 11.9 10/31/13 to 11 12/01/13--continue to monitor the patient as well as labs.             Edema Better BLE, continue Furosemide 20mg , BNP 343.5 12/01/13. Update BMP        Postherpetic neuralgia Questionable with Gabapentin and  Cymbalta 30mg -12/02/13-off Gabapentin, dc Cymbalta--no efficacy in mood or pain, try Norco tid for pain.                 Memory impairment Continue to decline.   Unspecified hypothyroidism Increased Levothyroxine from to due  to trending up TSH, TSH 3.573 10/02/13 and 5.681 12/01/13, update TSH in 8 weeks.                     A-fib Rate controlled on Digoxin 0.125mg  and Atenolol 50mg  daily, Dig level 0.3 10/02/13 and again 0.8 12/01/13                      Muscular deconditioning Persisted. Comfort measures per POA-husband. Hospice Referral.     Family/ Staff Communication: observe the patient.   Goals of Care: SNF, Hospice referral, comfort measures.   Labs/tests ordered: BMP

## 2013-12-09 NOTE — Assessment & Plan Note (Signed)
Controlled with mildly elevated SBP 140-150, takes Amlodipine 5mg , Atenolol 50mg  daily, Hydralazine 25mg  tid, Lasix 20mg 

## 2013-12-09 NOTE — Assessment & Plan Note (Signed)
The right heel 5cm x5cm-not infected, pressure reduction, hydrocolloid dressing q3-5 days.    

## 2013-12-09 NOTE — Assessment & Plan Note (Signed)
Persisted. Comfort measures per POA-husband. Hospice Referral.

## 2013-12-09 NOTE — Assessment & Plan Note (Signed)
Declined in cognition, mood, physical aspects. Will refer to Hospice. Comfort measures: Norco tid for now.

## 2013-12-09 NOTE — Assessment & Plan Note (Signed)
Distended abd and pain when palpated-resolved. Stable since Reglan was discontinued 12/02/13.  Patient does have abdominal distention with CT findings of infectious versus inflammation of admission. She continues to have loose BM. C DIFF per negative. Vomiting has resolved. Abdominal x-ray repeated on 12/8 shows distended bowel gas possibly ileus,. No obstruction seen. Surgery consulted recommended ambulation, replete K and prokinetic agents-fao;ed Reglan 5mg  po ac and hs for now.  -she had a CT abd showed improvement in the colon distension.

## 2013-12-09 NOTE — Assessment & Plan Note (Signed)
Questionable with Gabapentin and  Cymbalta 30mg -12/02/13-off Gabapentin, dc Cymbalta--no efficacy in mood or pain, try Norco tid for pain.

## 2013-12-09 NOTE — Assessment & Plan Note (Addendum)
CXR 11/27/12 showed CHF, bettter BLE edema and SOB and moist rales bibasilar. Compensated on Furosemide 20mg  along with Kcl 10meq daily. BNP 343.5 12/01/13

## 2013-12-09 NOTE — Assessment & Plan Note (Signed)
Hgb dropped from 11.9 10/31/13 to 11 12/01/13--continue to monitor the patient as well as labs.

## 2013-12-09 NOTE — Assessment & Plan Note (Signed)
Rate controlled on Digoxin 0.125mg  and Atenolol 50mg  daily, Dig level 0.3 10/02/13 and again 0.8 12/01/13

## 2013-12-09 NOTE — Assessment & Plan Note (Signed)
Continue to decline.  

## 2013-12-09 NOTE — Assessment & Plan Note (Signed)
Increased Levothyroxine from 125mcg to 150mcg due to trending up TSH, TSH 3.573 10/02/13 and 5.681 12/01/13, update TSH in 8 weeks.                  

## 2013-12-12 ENCOUNTER — Non-Acute Institutional Stay (SKILLED_NURSING_FACILITY): Payer: Medicare Other | Admitting: Nurse Practitioner

## 2013-12-12 ENCOUNTER — Encounter: Payer: Self-pay | Admitting: Nurse Practitioner

## 2013-12-12 DIAGNOSIS — L8992 Pressure ulcer of unspecified site, stage 2: Secondary | ICD-10-CM

## 2013-12-12 DIAGNOSIS — L8990 Pressure ulcer of unspecified site, unspecified stage: Secondary | ICD-10-CM

## 2013-12-12 DIAGNOSIS — R627 Adult failure to thrive: Secondary | ICD-10-CM

## 2013-12-12 NOTE — Assessment & Plan Note (Signed)
The right heel 5cm x5cm-not infected, pressure reduction, hydrocolloid dressing q3-5 days.    

## 2013-12-12 NOTE — Assessment & Plan Note (Signed)
Comfort measures with Morphine 10mg  q2 hrs, Ativan 1mg  q2hr prn, Atrophine 2gtt SL q2hr prn, Tylenol 650mg  q4hr prn, Neb tid.

## 2013-12-12 NOTE — Progress Notes (Signed)
Patient ID: Shirley Mccormick, female   DOB: [birth year], 80 y.o.   MRN: [id number]   Code Status: DNR  Allergies  Allergen Reactions  . Erythromycin Rash  . Penicillins Rash  . Tetanus Toxoids Rash    Chief Complaint  Patient presents with  . Medical Managment of Chronic Issues    end of life care  . Acute Visit    HPI: Patient is a 80 y.o. female seen in the SNF at [facility] today for evaluation of end of life care.  Problem List Items Addressed This Visit   FTT (failure to thrive) in adult - Primary     Comfort measures with Morphine 10mg  q2 hrs, Ativan 1mg  q2hr prn, Atrophine 2gtt SL q2hr prn, Tylenol 650mg  q4hr prn, Neb tid.       Pressure ulcer stage II     The right heel 5cm x5cm-not infected, pressure reduction, hydrocolloid dressing q3-5 days.            Review of Systems:  Review of Systems  Constitutional: Positive for fever and malaise/fatigue. Negative for chills, weight loss and diaphoresis.       Generalized and lower body weakness. Opens eye to tactile stimuli.   HENT: Positive for hearing loss. Negative for congestion, ear discharge, ear pain, nosebleeds, sore throat and tinnitus.   Respiratory: Positive for shortness of breath and wheezing. Negative for cough, hemoptysis, sputum production and stridor.   Cardiovascular: Positive for leg swelling. Negative for chest pain, palpitations, orthopnea, claudication and PND.       BLE  Gastrointestinal:       Distended abd  Genitourinary: Positive for frequency. Negative for dysuria, urgency, hematuria and flank pain.  Musculoskeletal: Positive for back pain. Negative for falls, joint pain, myalgias and neck pain.       From her left shingle site travels to her left back.   Skin: Positive for rash.       Left flank and abd shingle lesions healing nicely. R heel stage II pressure ulcer not infected.   Neurological: Positive for sensory change, speech change, loss of consciousness and weakness. Negative for  dizziness, tingling, tremors, focal weakness, seizures and headaches.       BLE needle pricking. Left flank postherpetic neuralgia.   Psychiatric/Behavioral: Positive for memory loss. The patient does not have insomnia.        Only open eyes when she is touched.      Past Medical History  Diagnosis Date  . Afib   . Shingles 10/01/13  . Pulmonary embolism 1990  . Guillain-Barre 1990    resolved  . Unspecified hypothyroidism   . Neuropathy   . Muscle weakness (generalized)    Past Surgical History  Procedure Laterality Date  . Breast lumpectomy Right 1965    Dr. Judithe Modest   Social History:   reports that she has never smoked. She does not have any smokeless tobacco history on file. She reports that she does not drink alcohol or use illicit drugs.  Medications: Patient's Medications  New Prescriptions   No medications on file  Previous Medications   ACETAMINOPHEN (TYLENOL) 325 MG TABLET    Take 650 mg by mouth every 4 (four) hours as needed.   ATROPINE 1 % OPHTHALMIC SOLUTION    Place 2 drops under the tongue every 2 (two) hours as needed.   IPRATROPIUM-ALBUTEROL (DUONEB) 0.5-2.5 (3) MG/3ML SOLN    Take 3 mLs by nebulization 3 (three) times daily.   LORAZEPAM (ATIVAN)  1 MG TABLET    Take 1 mg by mouth every 2 (two) hours as needed for anxiety.   MORPHINE (ROXANOL) 20 MG/ML CONCENTRATED SOLUTION    Take 10 mg by mouth every 2 (two) hours.  Modified Medications   No medications on file  Discontinued Medications   ACETAMINOPHEN (TYLENOL) 325 MG TABLET    Take 650 mg by mouth every 6 (six) hours as needed for mild pain.   AMLODIPINE (NORVASC) 5 MG TABLET    Take 1 tablet (5 mg total) by mouth daily.   ATENOLOL (TENORMIN) 50 MG TABLET    Take 50 mg by mouth daily.   CHOLECALCIFEROL (VITAMIN D) 1000 UNITS TABLET    Take 1,000 Units by mouth every morning.    DIGOXIN (LANOXIN) 0.125 MG TABLET    Take 0.125 mg by mouth every morning.    DULOXETINE (CYMBALTA) 30 MG CAPSULE    Take 30  mg by mouth daily.   FEEDING SUPPLEMENT, ENSURE COMPLETE, (ENSURE COMPLETE) LIQD    Take 237 mLs by mouth 2 (two) times daily between meals.   HYDRALAZINE (APRESOLINE) 25 MG TABLET    Take 1 tablet (25 mg total) by mouth every 8 (eight) hours.   HYDROCODONE-ACETAMINOPHEN (NORCO/VICODIN) 5-325 MG PER TABLET    Take one twice daily as needed for pain   IPRATROPIUM-ALBUTEROL (DUONEB) 0.5-2.5 (3) MG/3ML SOLN    Take 3 mLs by nebulization 3 (three) times daily. For 3 days only. START 12.3.14 END 12.5.14   LEVOTHYROXINE (SYNTHROID, LEVOTHROID) 150 MCG TABLET    Take 150 mcg by mouth daily before breakfast.   MOXIFLOXACIN (AVELOX) 400 MG TABLET    Take 400 mg by mouth. Take one daily for 14 days stop after 12/11/13   WARFARIN (COUMADIN) 5 MG TABLET    Take 5 mg by mouth daily.     Physical Exam: Physical Exam  Constitutional: She is oriented to person, place, and time. She appears well-developed and well-nourished. She appears distressed.  HENT:  Head: Normocephalic and atraumatic.  Right Ear: External ear normal.  Left Ear: External ear normal.  Nose: Nose normal.  Mouth/Throat: Oropharynx is clear and moist.  Eyes: Conjunctivae and EOM are normal. Pupils are equal, round, and reactive to light.  Neck: Normal range of motion. Neck supple. No JVD present. No tracheal deviation present.  Cardiovascular: Normal rate, regular rhythm, normal heart sounds and intact distal pulses.   No murmur heard. Pulmonary/Chest: Effort normal. No stridor. No respiratory distress. She has wheezes. She has rales.  Abdominal: Soft. She exhibits distension. Bowel sounds are increased.  Musculoskeletal: Normal range of motion. She exhibits edema. She exhibits no tenderness.  BLE edema trace  Neurological: She is alert and oriented to person, place, and time. She has normal reflexes. No cranial nerve deficit. She exhibits normal muscle tone. Coordination normal.  Skin: Skin is warm and dry. Rash noted. There is  erythema.  Shingles left flank and abd-healing nicely. R heel stage II pressure ulcer not infected.    Psychiatric: She is agitated. Cognition and memory are impaired. She exhibits abnormal recent memory and abnormal remote memory.    Filed Vitals:   12/12/13 1654  BP: 100/60  Pulse: 92  Temp: 99.1 F (37.3 C)  TempSrc: Tympanic  Resp: 22      Labs reviewed: Basic Metabolic Panel:  Recent Labs  40/98/11 0522 11/01/13 1610  11/08/13 0538 11/09/13 0440 11/10/13 0615 11/13/13 12/01/13  NA 121*  --   < > 138  138 143 144 142  K 3.6  --   < > 3.8 3.7 4.2 3.6 3.8  CL 83*  --   < > 99 97 101  --   --   CO2 23  --   < > 29 28 30   --   --   GLUCOSE 115*  --   < > 122* 122* 127*  --   --   BUN 10  --   < > 17 21 26* 24* 21  CREATININE 0.54  --   < > 0.59 0.62 0.72 0.7 0.6  CALCIUM 8.7  --   < > 8.7 8.7 9.1  --   --   TSH  --  3.021  --   --   --   --   --  5.68  < > = values in this interval not displayed. Liver Function Tests:  Recent Labs  10/01/13 1301 11/01/13 0522 12/01/13  AST 31 23 32  ALT 21 13 29   ALKPHOS 47 42 50  BILITOT 0.4 0.7  --   PROT 7.6 6.8  --   ALBUMIN 3.7 3.1*  --    CBC:  Recent Labs  10/01/13 1301  10/31/13 1845  11/01/13 0522 11/06/13 0526 11/08/13 0538 12/01/13  WBC 5.2  < > 7.5  --  8.0 9.8 11.6* 9.2  NEUTROABS 3.0  --  5.2  --   --   --   --   --   HGB 13.5  < > 12.4  < > 12.6 11.9* 13.1 11.0*  HCT 39.3  < > 35.0*  < > 35.7* 34.4* 37.6 33*  MCV 87.5  --  88.2  --  88.4 87.8 90.2  --   PLT 187  < > 222  --  197 254 307 236  < > = values in this interval not displayed.  Past Procedures:  Ct Head Wo Contrast  10/31/2013 CLINICAL DATA: Projectile vomiting this morning. Fell 3 days ago. EXAM: CT HEAD WITHOUT CONTRAST CT CERVICAL SPINE WITHOUT CONTRAST: IMPRESSION: 1. Acute left sphenoid sinusitis vs. blood in the sinus. 2. No skull fracture or intracranial hemorrhage seen. 3. No cervical spine fracture or traumatic subluxation. 4.  Multilevel cervical spine degenerative changes. 5. Atrophy and chronic small vessel white matter ischemic changes in both cerebral hemispheres.   Ct Cervical Spine Wo Contrast  10/31/2013 CLINICAL DATA: Projectile vomiting this morning. Fell 3 days ago. EXAM: CT HEAD WITHOUT CONTRAST CT CERVICAL SPINE WITHOUT CONTRAST IMPRESSION: 1. Acute left sphenoid sinusitis vs. blood in the sinus. 2. No skull fracture or intracranial hemorrhage seen. 3. No cervical spine fracture or traumatic subluxation. 4. Multilevel cervical spine degenerative changes. 5. Atrophy and chronic small vessel white matter ischemic changes in both cerebral hemispheres.   Ct Abdomen Pelvis W Contrast  11/10/2013 CLINICAL DATA: Followup to abdominal CT performed on 10/31/2013 showing wall thickening of the mid sigmoid colon. EXAM: CT ABDOMEN AND PELVIS WITH CONTRAST IMPRESSION: 1. There is now no evidence of colonic inflammation. There is no bowel wall thickening. The appearance on the current exam is consistent with a diffuse colonic adynamic ileus with mild distention of the colon and rectum and air-fluid levels. 2. No obstruction. The stomach is decompressed and the small bowel normal in caliber. 3. Cardiomegaly, lung base scarring/subsegmental atelectasis and small left effusion, stable from the prior study. 4. Chronic findings in the abdomen and pelvis as detailed above all stable.   Ct Abdomen Pelvis W Contrast  10/31/2013 CLINICAL DATA: Abdominal distention, vomiting and fall. Altered mental status. EXAM: CT ABDOMEN AND PELVIS WITH CONTRAST IMPRESSION: 1. Intermittent distention of the colon, still within normal limits in caliber, with fluid filling the mid to distal sigmoid colon and rectum. Apparent mild wall thickening along the mid sigmoid colon may reflect a mild infectious or inflammatory process. 2. Trace left pleural effusion, with mild bilateral peripheral scarring. 3. Diffuse calcification along the abdominal aorta and its  branches, with mild thrombus along the distal descending thoracic aorta. 4. Nonspecific diffuse presacral stranding noted. 5. Left convex lumbar scoliosis noted.   Dg Abd 2 Views  11/08/2013 CLINICAL DATA: Abdominal distention EXAM: ABDOMEN - 2 VIEW IMPRESSION: Changes most consistent with an ileus. The overall appearance is stable from the previous exam.   Dg Abd 2 Views  11/07/2013 CLINICAL DATA: Abdominal distention and pain EXAM: ABDOMEN - 2 VIIEWS IMPRESSION: Findings again likely reflecting an ileus. There does appear to be slight decompression.   Dg Abd 2 Views  11/06/2013 CLINICAL DATA: Distended abdomen. EXAM: ABDOMEN - 2 VIEW IMPRESSION: Gas distended colon measuring up to 9.9 cm. This may represent an ileus related to inflammation although distal obstructing lesion not excluded. No free intraperitoneal air.   Dg Abd Acute W/chest  10/31/2013 CLINICAL DATA: Distended abdomen. EXAM: ACUTE ABDOMEN SERIES (ABDOMEN 2 VIEW & CHEST 1 VIEW) COMPARISON:  IMPRESSION: 1. Moderate distension of the large bowel loops identified. Findings may reflect colonic ileus or distal bowel obstruction. Consider further assessment with CT of the abdomen and pelvis with oral contrast material. 2. Suspect mild CHF.   Dg Abd Portable 1v  11/03/2013 CLINICAL DATA: Abdominal distension question obstruction EXAM: PORTABLE ABDOMEN IMPRESSION: Nonspecific bowel gas pattern with air-filled loops of large and small bowel throughout abdomen. No definite evidence of obstruction or wall thickening.   11/14/13 CXR KUB patchy interstitial changes medial right lower lung consistent with pneumonia, mild cardiomegaly and slight elevation of the pulmonary vasculature suggests congestive heart failure. Consistent with a mild adynamic ileus, a moderate amount of fecal material is present throughout the colon.   11/27/12 CXR consistent with mild congestive heart failure, the interstitial changes in the right lower lung are most  consistent with pneumonia with minimal changes from 11/24/13.   Assessment/Plan FTT (failure to thrive) in adult Comfort measures with Morphine 10mg  q2 hrs, Ativan 1mg  q2hr prn, Atrophine 2gtt SL q2hr prn, Tylenol 650mg  q4hr prn, Neb tid.     Pressure ulcer stage II The right heel 5cm x5cm-not infected, pressure reduction, hydrocolloid dressing q3-5 days.         Family/ Staff Communication: observe the patient.   Goals of Care: SNF, Hospice referral, comfort measures.   Labs/tests ordered: no further labs.

## 2013-12-28 DEATH — deceased

## 2014-04-19 IMAGING — CT CT CERVICAL SPINE W/O CM
3 of 4 series · 13 of 29 positions shown, 15 images · non-contrast
Comparison: None.

CLINICAL DATA: Projectile vomiting this morning.  Fell 3 days ago.

EXAM:
CT HEAD WITHOUT CONTRAST
CT CERVICAL SPINE WITHOUT CONTRAST
TECHNIQUE: Multidetector CT imaging of the head and cervical spine was
performed following the standard protocol without intravenous
contrast. Multiplanar CT image reconstructions of the cervical spine
were also generated.

[Series 4: c_spine 2.0 i40s 3 · axial · 0.41mm/px · z∈[+1148,+1240]mm · 4 of 78 slices shown, 5 images]
[im 16/78  soft-tissue]
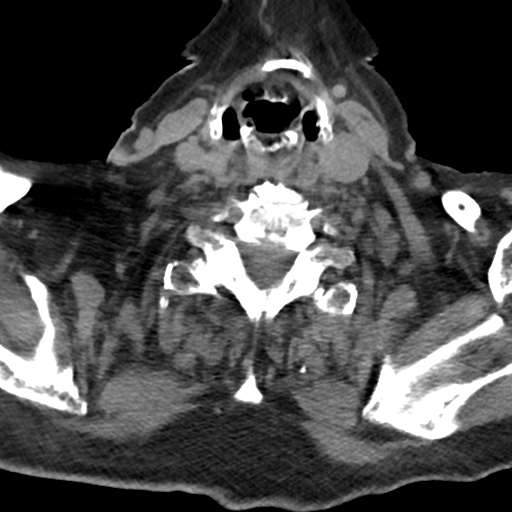
[im 16/78  bone]
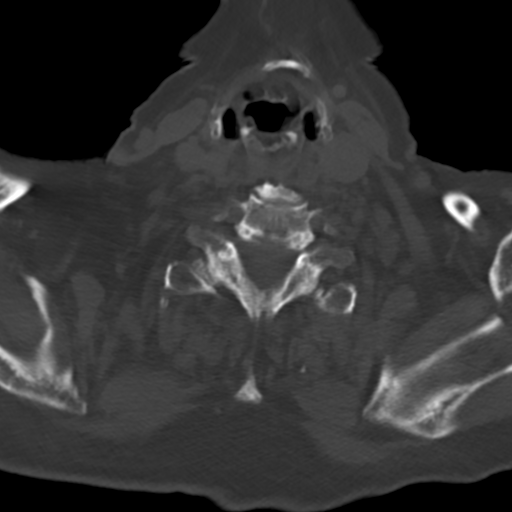
[im 31/78  bone]
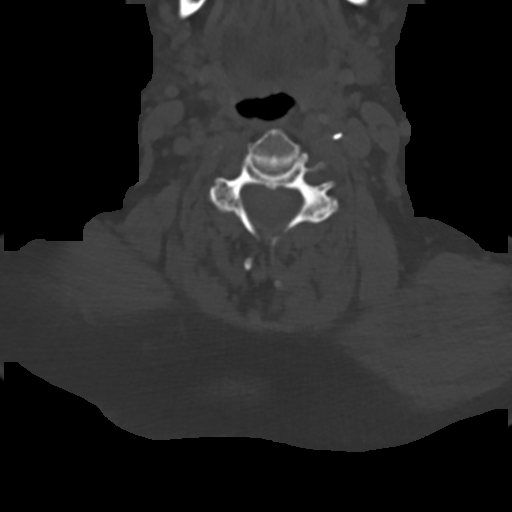
[im 47/78  bone]
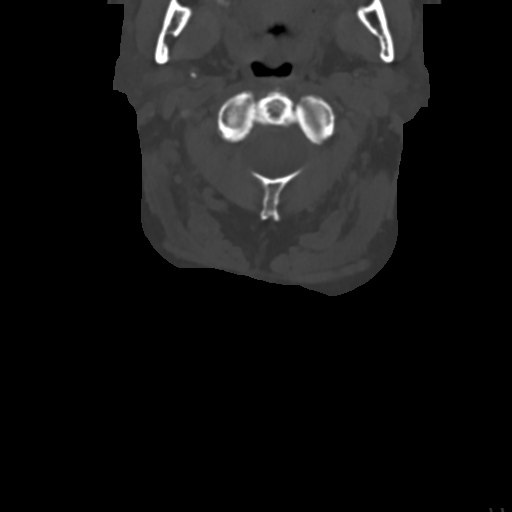
[im 62/78  bone]
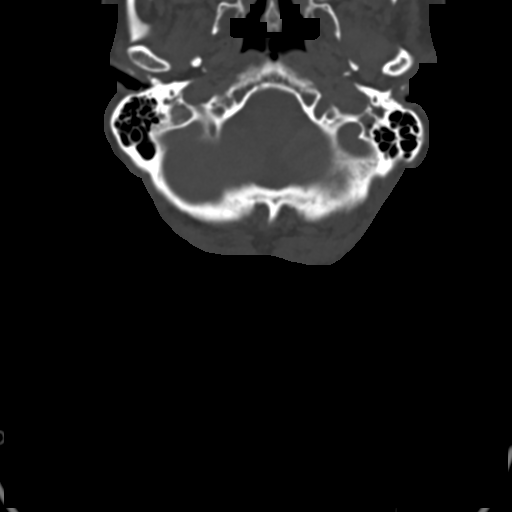

[Series 7: sagittals · sagittal · 0.33mm/px · 5 of 60 slices shown, 6 images]
[im 20/60  bone]
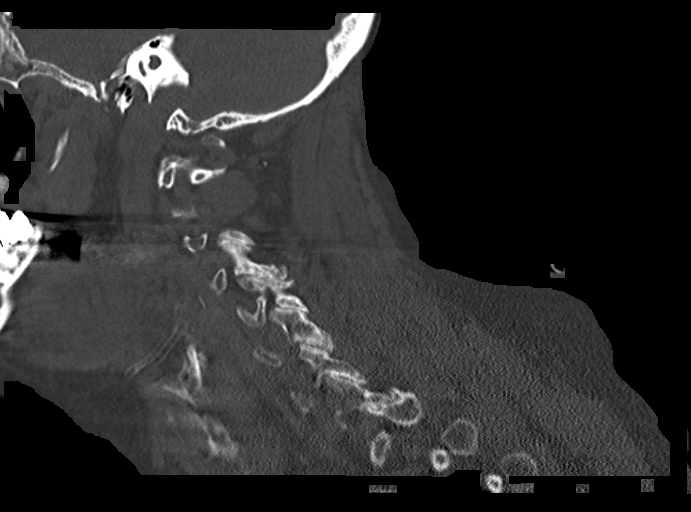
[im 25/60  bone]
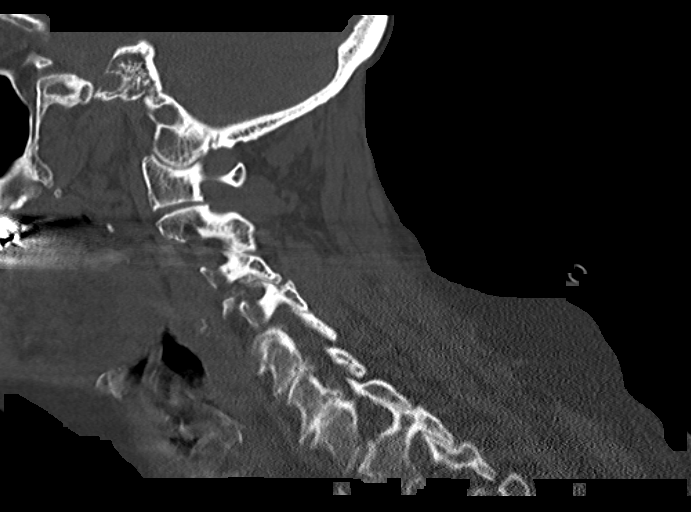
[im 30/60  soft-tissue]
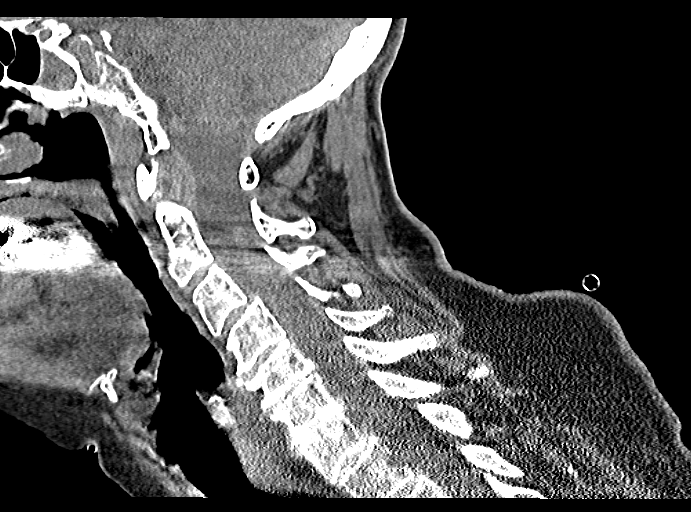
[im 30/60  bone]
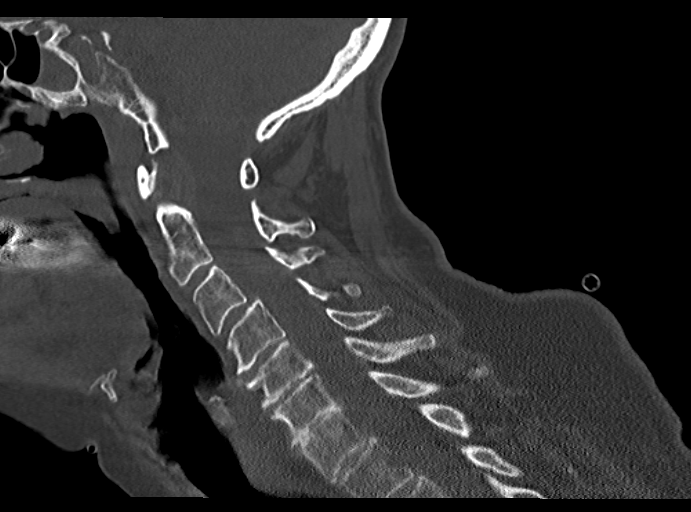
[im 35/60  bone]
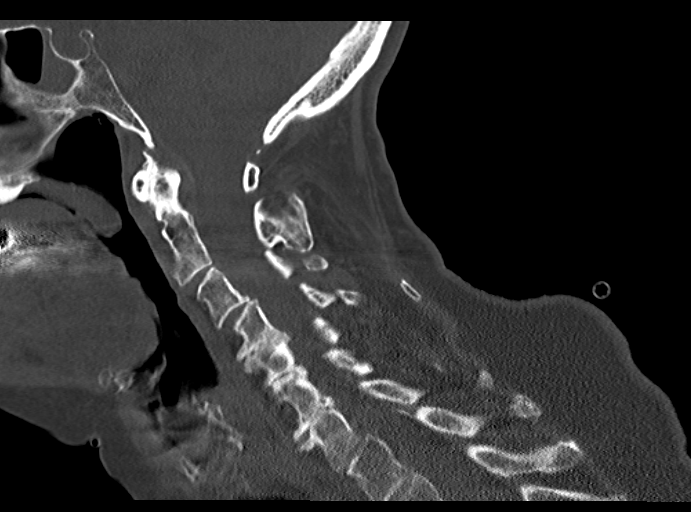
[im 40/60  bone]
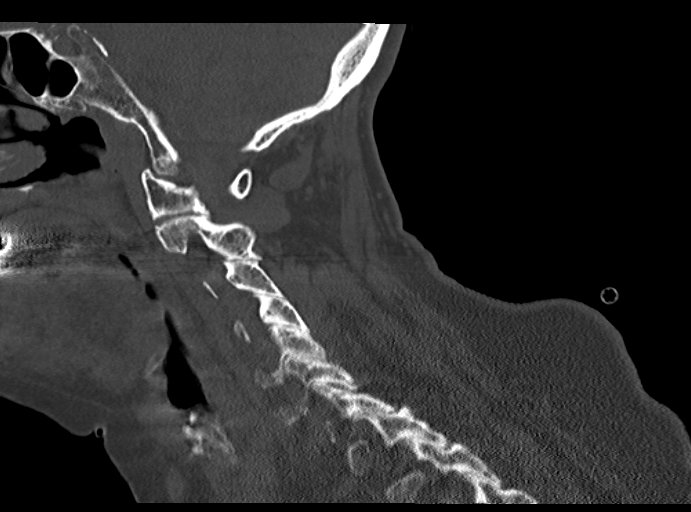

[Series 8: orthogonals · axial · 0.20mm/px · z∈[+1107,+1181]mm · 4 of 76 slices shown]
[im 16/76  bone]
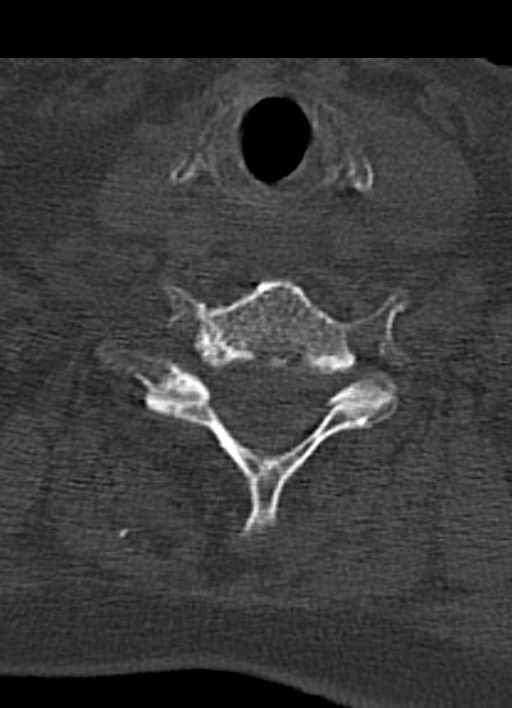
[im 31/76  bone]
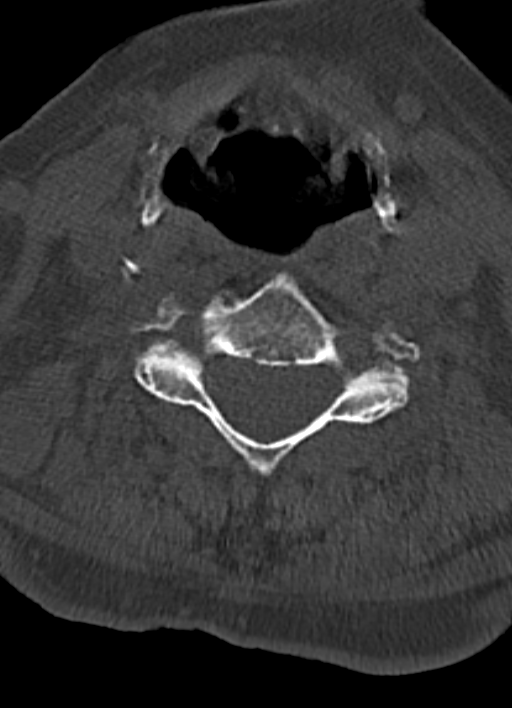
[im 46/76  bone]
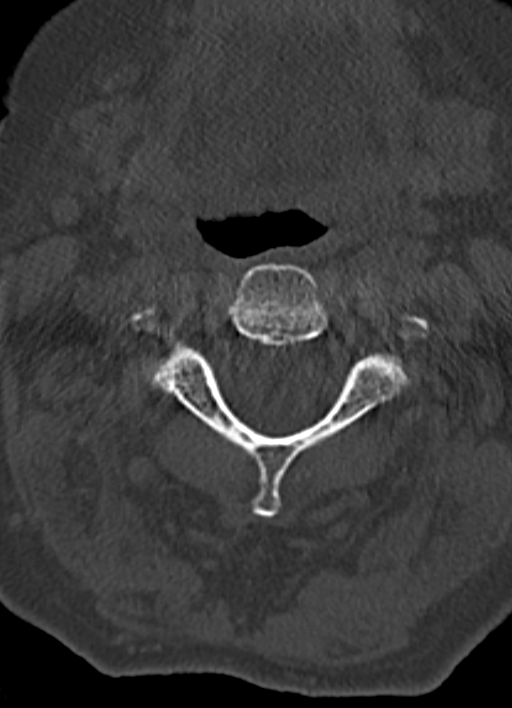
[im 61/76  bone]
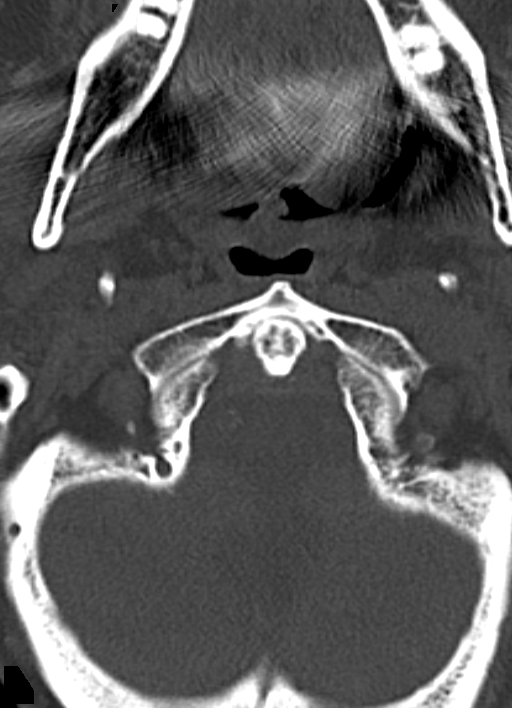

[13 of 29 positions shown; findings below may reference images not displayed]

FINDINGS: CT HEAD FINDINGS

Diffusely enlarged ventricles and subarachnoid spaces. Sphenoid
sinus air-fluid level. No skull fracture or intracranial hemorrhage
seen.

CT CERVICAL SPINE FINDINGS

Multilevel degenerative changes. These include facet degenerative
changes at multiple levels, most pronounced on the left at the C3-4
level, with associated 3 mm of anterolisthesis at that level. No
prevertebral soft tissue swelling or fractures seen. Biapical
parenchymal scarring with calcification. Previously noted sphenoid
sinus air-fluid level. No skull base fracture visualized.
IMPRESSION: 1. Acute left sphenoid sinusitis vs. blood in the sinus.
2. No skull fracture or intracranial hemorrhage seen.
3. No cervical spine fracture or traumatic subluxation.
4. Multilevel cervical spine degenerative changes.
5. Atrophy and chronic small vessel white matter ischemic changes in
both cerebral hemispheres.

## 2014-04-19 IMAGING — CT CT ABD-PELV W/ CM
2 of 5 series · 16 of 46 positions shown, 18 images · IV contrast (omnipaque)
Comparison: Abdominal radiograph performed earlier today at [DATE]
p.m.

CLINICAL DATA: Abdominal distention, vomiting and fall. Altered
mental status.

EXAM:
CT ABDOMEN AND PELVIS WITH CONTRAST
TECHNIQUE: Multidetector CT imaging of the abdomen and pelvis was performed
using the standard protocol following bolus administration of
intravenous contrast.
CONTRAST:  100mL OMNIPAQUE IOHEXOL 300 MG/ML  SOLN

[Series 2: abd/ pelvis 5.0 i30f 1 · axial · 0.80mm/px · z∈[-470,-70]mm · 13 of 92 slices shown, 15 images]
[im 6/92  soft-tissue]
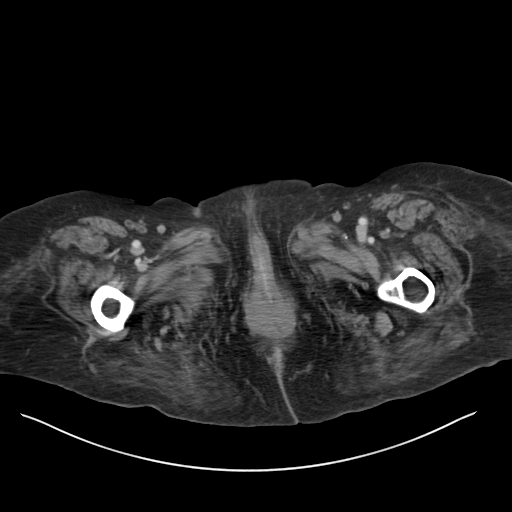
[im 6/92  bone]
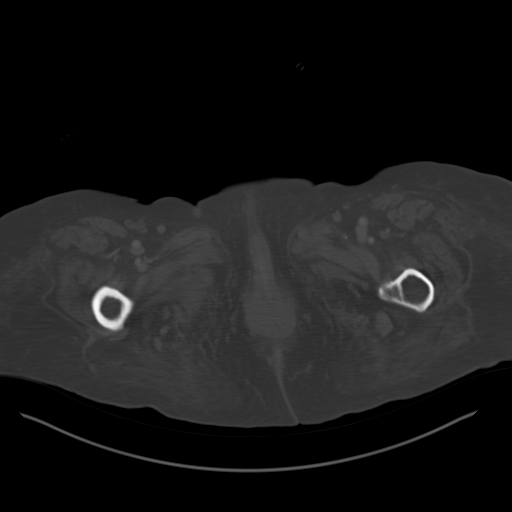
[im 11/92  soft-tissue]
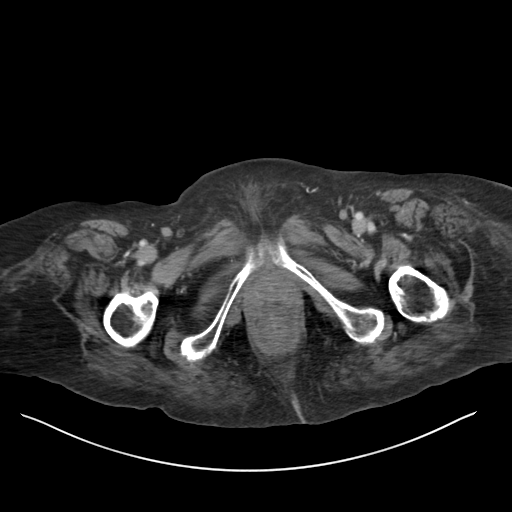
[im 22/92  soft-tissue]
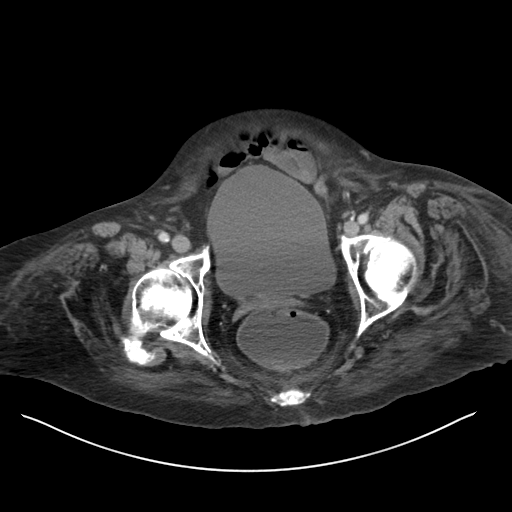
[im 27/92  soft-tissue]
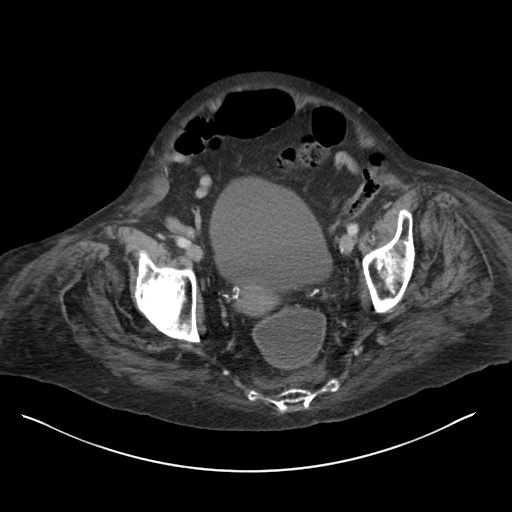
[im 33/92  soft-tissue]
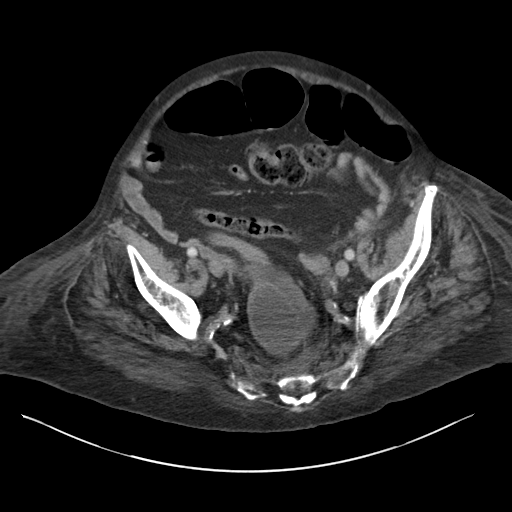
[im 38/92  soft-tissue]
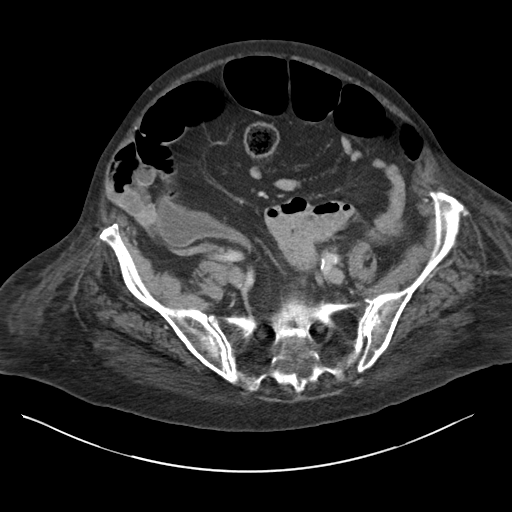
[im 49/92  soft-tissue]
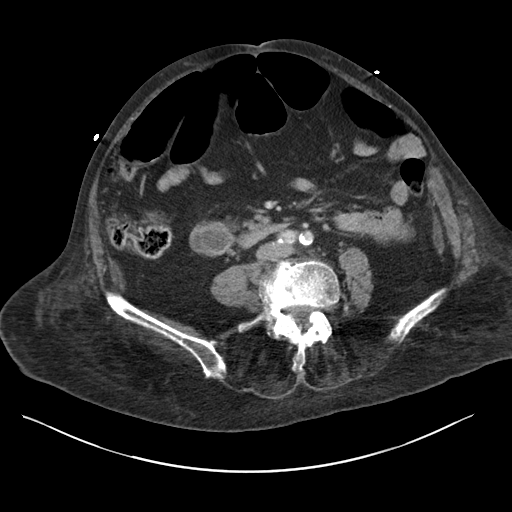
[im 54/92  soft-tissue]
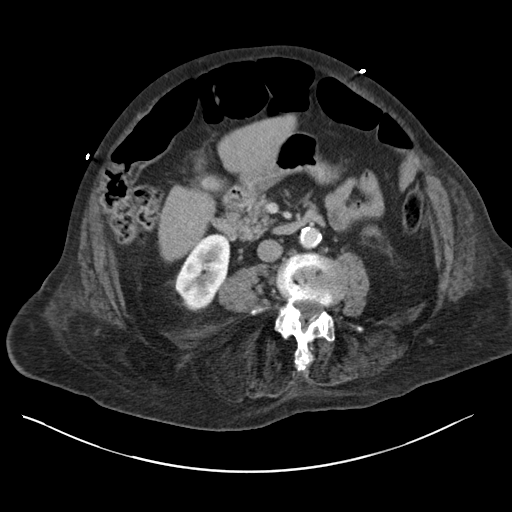
[im 59/92  soft-tissue]
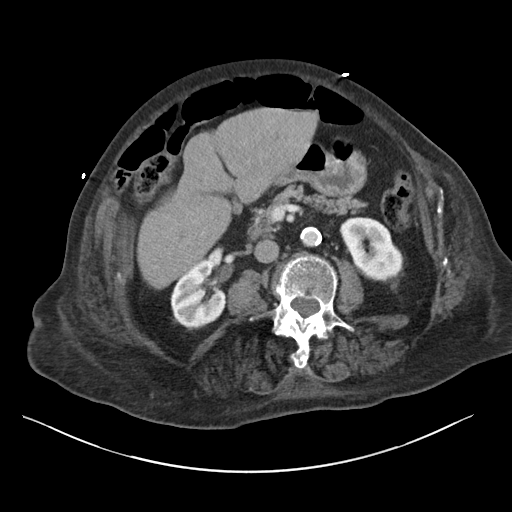
[im 59/92  bone]
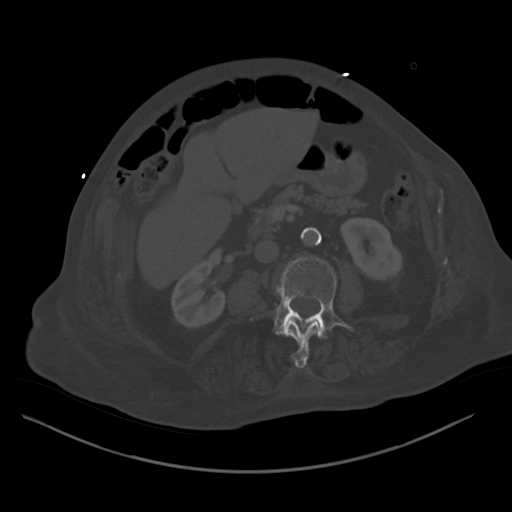
[im 65/92  soft-tissue]
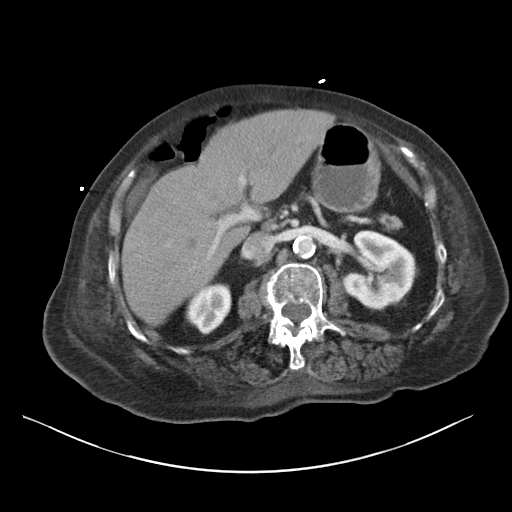
[im 70/92  soft-tissue]
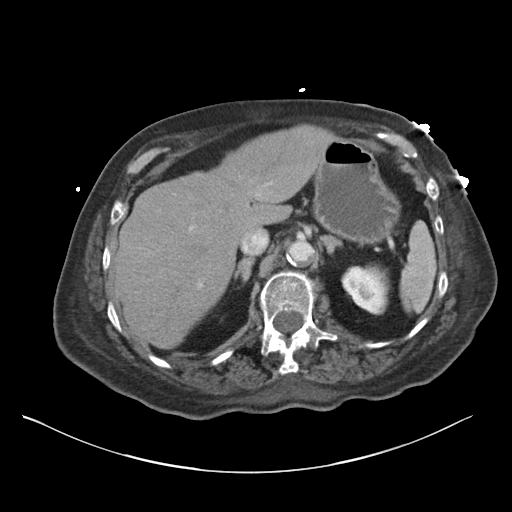
[im 81/92  soft-tissue]
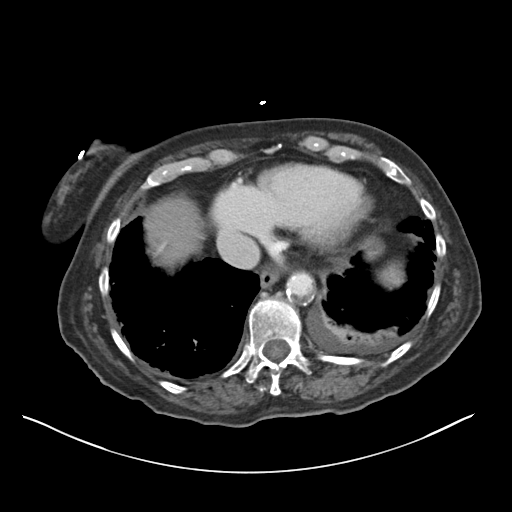
[im 86/92  soft-tissue]
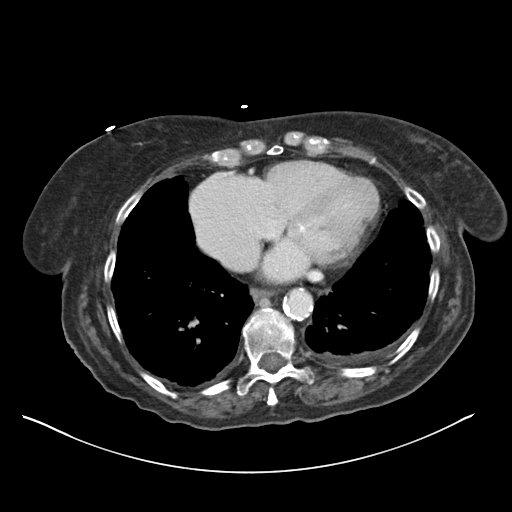

[Series 5: cor · coronal · 0.86mm/px · 3 of 156 slices shown]
[im 52/156  soft-tissue]
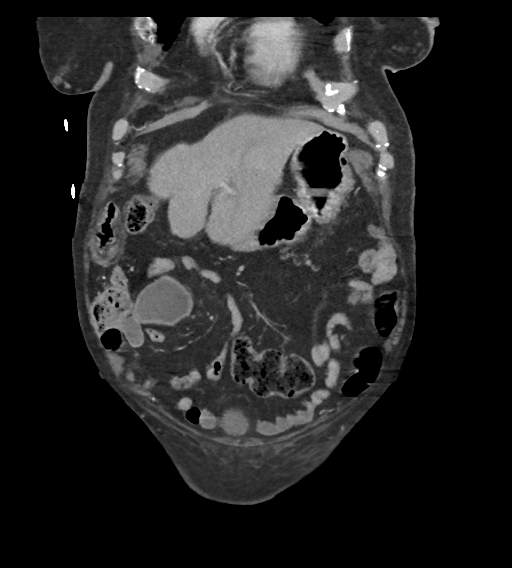
[im 69/156  soft-tissue]
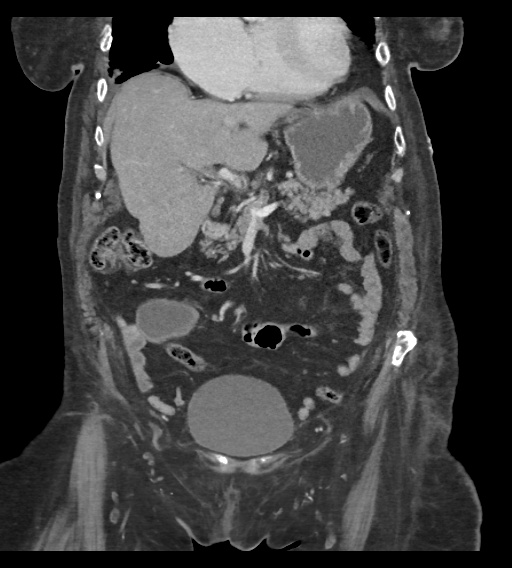
[im 87/156  soft-tissue]
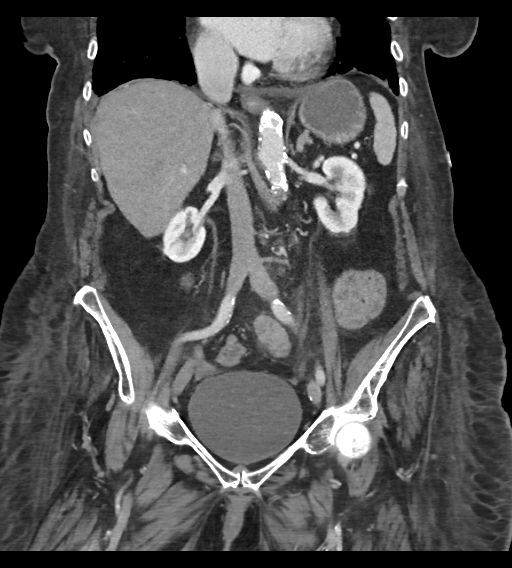

[16 of 46 positions shown; findings below may reference images not displayed]

FINDINGS: A trace left pleural effusion is noted, with mild bilateral
peripheral scarring.

There is a slightly nodular contour to the liver, without definite
evidence of cirrhotic change. The spleen is unremarkable in
appearance; vascular calcification is noted at the splenic hilum.
The gallbladder is decompressed and not well assessed. The pancreas
and adrenal glands are within normal limits.

The kidneys are unremarkable in appearance. There is no evidence of
hydronephrosis. No renal or ureteral stones are seen. Nonspecific
perinephric stranding is noted bilaterally.

No free fluid is identified. The small bowel is unremarkable in
appearance. The stomach is within normal limits. No acute vascular
abnormalities are seen. Diffuse calcification is noted along the
abdominal aorta and its branches, with mild thrombus noted along the
distal descending thoracic aorta.

The appendix is normal in caliber, without evidence for
appendicitis. There is intermittent distention of the colon, still
within normal limits in caliber, with fluid seen filling the mid to
distal sigmoid colon and rectum. Apparent mild wall thickening is
noted along the mid sigmoid colon, possibly reflecting a mild
infectious or inflammatory process. The colon is mildly redundant.

The bladder is mildly distended and grossly unremarkable in
appearance. The uterus remains normal in size, with a single small
calcified fibroid. The ovaries are relatively symmetric ; no
suspicious adnexal masses are seen. Diffuse presacral stranding is
nonspecific in appearance. No inguinal lymphadenopathy is seen.

No acute osseous abnormalities are identified. Left convex lumbar
scoliosis is noted, with associated vacuum phenomenon.
IMPRESSION: 1. Intermittent distention of the colon, still within normal limits
in caliber, with fluid filling the mid to distal sigmoid colon and
rectum. Apparent mild wall thickening along the mid sigmoid colon
may reflect a mild infectious or inflammatory process.
2. Trace left pleural effusion, with mild bilateral peripheral
scarring.
3. Diffuse calcification along the abdominal aorta and its branches,
with mild thrombus along the distal descending thoracic aorta.
4. Nonspecific diffuse presacral stranding noted.
5. Left convex lumbar scoliosis noted.

## 2014-04-22 IMAGING — CR DG ABD PORTABLE 1V
2 series · 2 of 2 positions shown · non-contrast
Comparison: Portable exam 1217 hr compared to 10/31/2013

CLINICAL DATA: Abdominal distension question obstruction

EXAM:
PORTABLE ABDOMEN - 1 VIEW

[AP (1 of 2)]
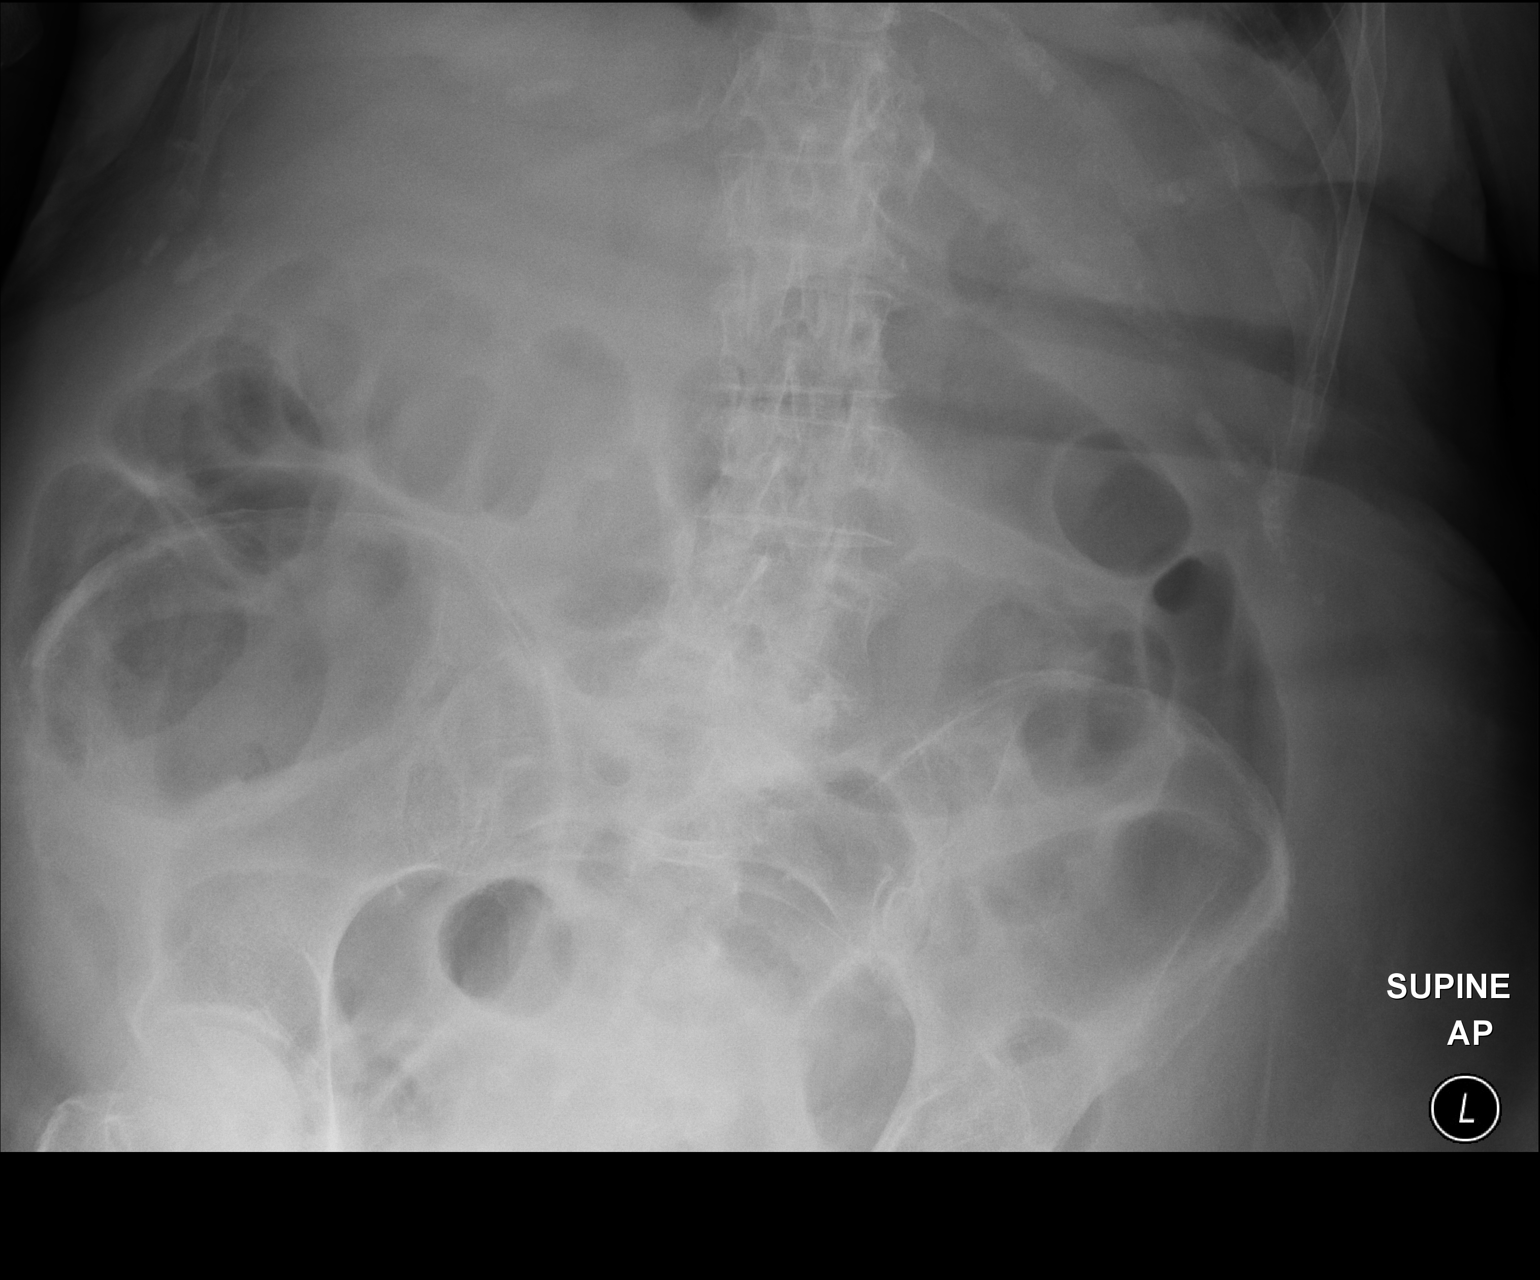

[AP (2 of 2)]
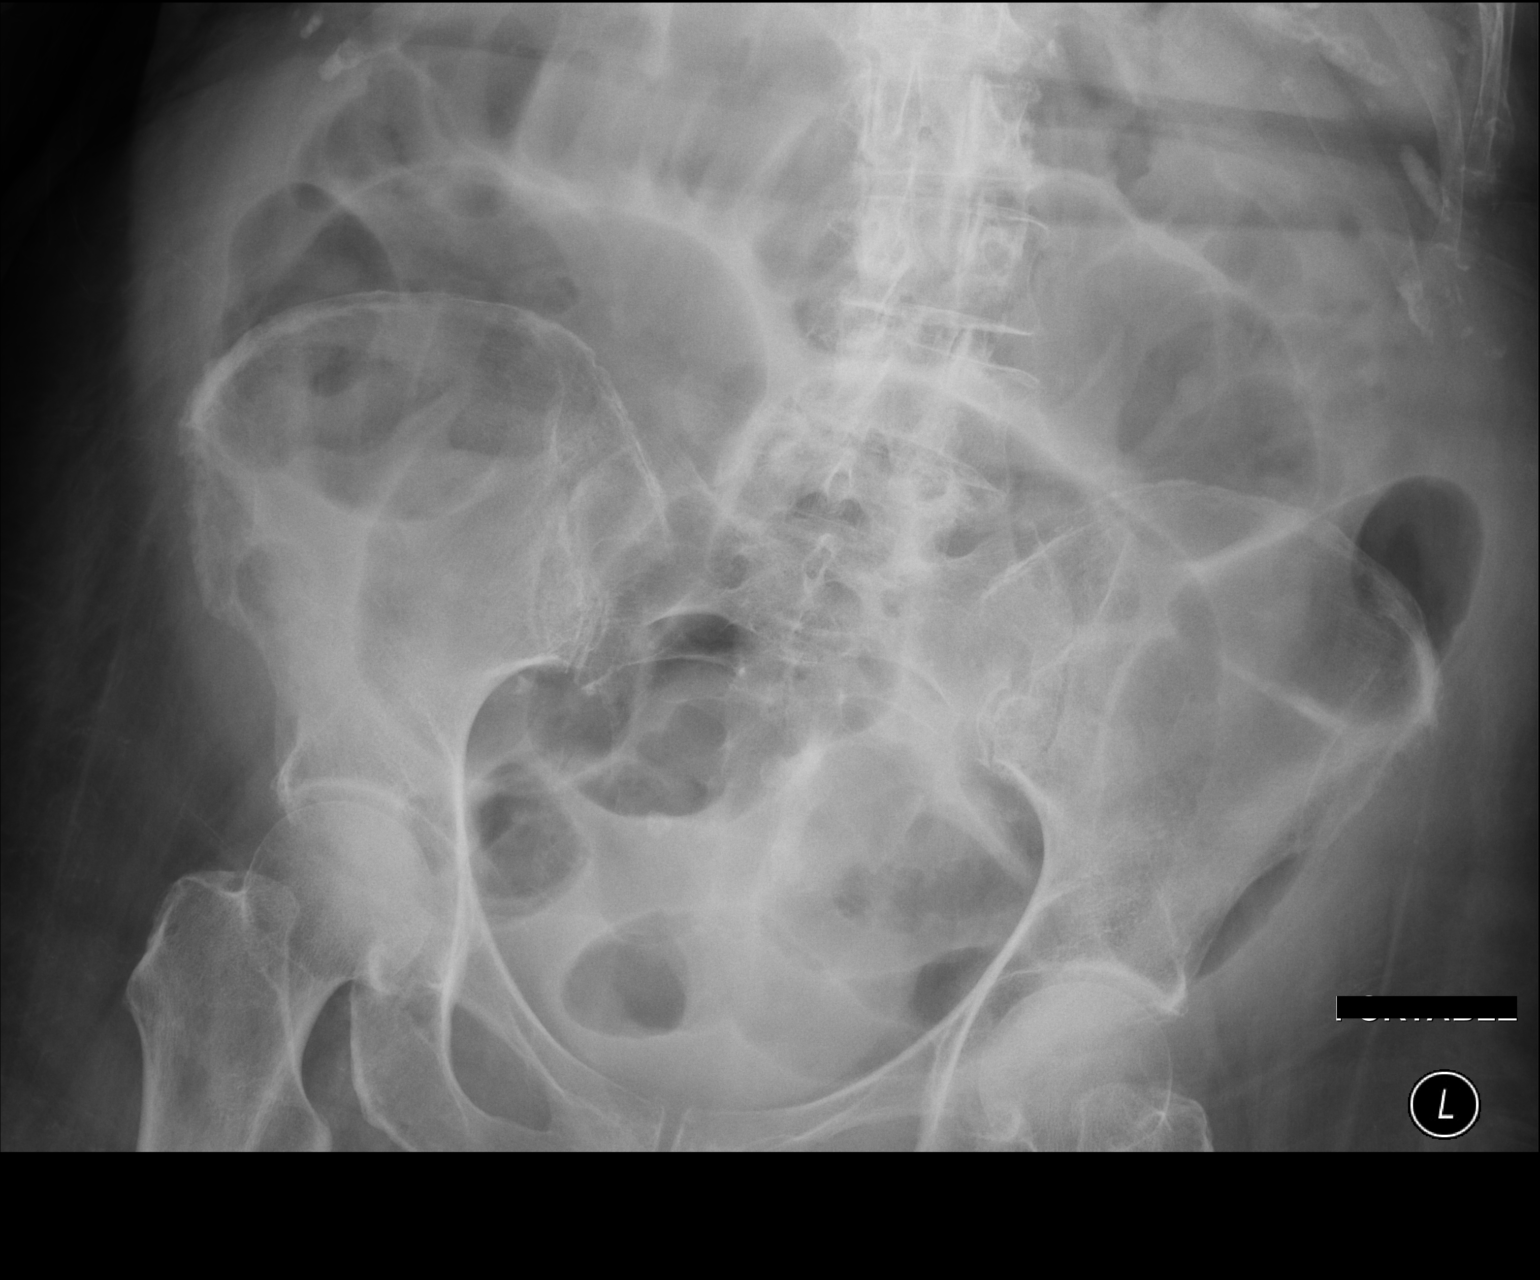

[2 of 2 positions shown; findings below may reference images not displayed]

FINDINGS: Air-filled loops of large and small bowel throughout abdomen.

No definite bowel wall thickening.

Bones appear diffusely demineralized with mild scattered
degenerative changes and scoliosis of the lumbar spine.

No pathologic calcifications.
IMPRESSION: Nonspecific bowel gas pattern with air-filled loops of large and
small bowel throughout abdomen.

No definite evidence of obstruction or wall thickening.
